# Patient Record
Sex: Male | Born: 1975 | Race: White | Hispanic: No | Smoking: Never smoker
Health system: Southern US, Community
[De-identification: ages and names within clinical notes are randomized; demographics above are authoritative.]

---

## 1997-11-17 ENCOUNTER — Emergency Department (HOSPITAL_COMMUNITY): Admission: EM | Admit: 1997-11-17 | Discharge: 1997-11-17 | Payer: Self-pay | Admitting: Emergency Medicine

## 1998-01-30 ENCOUNTER — Emergency Department (HOSPITAL_COMMUNITY): Admission: EM | Admit: 1998-01-30 | Discharge: 1998-01-30 | Payer: Self-pay

## 1998-05-02 ENCOUNTER — Encounter: Payer: Self-pay | Admitting: Emergency Medicine

## 1998-05-02 ENCOUNTER — Emergency Department (HOSPITAL_COMMUNITY): Admission: EM | Admit: 1998-05-02 | Discharge: 1998-05-02 | Payer: Self-pay | Admitting: Emergency Medicine

## 1998-09-03 ENCOUNTER — Emergency Department (HOSPITAL_COMMUNITY): Admission: EM | Admit: 1998-09-03 | Discharge: 1998-09-03 | Payer: Self-pay | Admitting: Emergency Medicine

## 1998-09-03 ENCOUNTER — Encounter: Payer: Self-pay | Admitting: Emergency Medicine

## 1999-01-11 ENCOUNTER — Encounter: Payer: Self-pay | Admitting: Emergency Medicine

## 1999-01-11 ENCOUNTER — Emergency Department (HOSPITAL_COMMUNITY): Admission: EM | Admit: 1999-01-11 | Discharge: 1999-01-11 | Payer: Self-pay | Admitting: Emergency Medicine

## 1999-03-14 ENCOUNTER — Encounter: Payer: Self-pay | Admitting: Emergency Medicine

## 1999-03-14 ENCOUNTER — Emergency Department (HOSPITAL_COMMUNITY): Admission: EM | Admit: 1999-03-14 | Discharge: 1999-03-14 | Payer: Self-pay | Admitting: Emergency Medicine

## 1999-05-29 ENCOUNTER — Encounter: Payer: Self-pay | Admitting: Emergency Medicine

## 1999-05-29 ENCOUNTER — Emergency Department (HOSPITAL_COMMUNITY): Admission: EM | Admit: 1999-05-29 | Discharge: 1999-05-29 | Payer: Self-pay | Admitting: Emergency Medicine

## 1999-08-18 ENCOUNTER — Emergency Department (HOSPITAL_COMMUNITY): Admission: EM | Admit: 1999-08-18 | Discharge: 1999-08-18 | Payer: Self-pay | Admitting: Emergency Medicine

## 1999-08-18 ENCOUNTER — Encounter: Payer: Self-pay | Admitting: Emergency Medicine

## 1999-12-22 ENCOUNTER — Emergency Department (HOSPITAL_COMMUNITY): Admission: EM | Admit: 1999-12-22 | Discharge: 1999-12-22 | Payer: Self-pay | Admitting: *Deleted

## 2000-05-01 ENCOUNTER — Encounter: Payer: Self-pay | Admitting: Occupational Medicine

## 2000-05-01 ENCOUNTER — Encounter: Admission: RE | Admit: 2000-05-01 | Discharge: 2000-05-01 | Payer: Self-pay | Admitting: Occupational Medicine

## 2000-06-20 ENCOUNTER — Emergency Department (HOSPITAL_COMMUNITY): Admission: EM | Admit: 2000-06-20 | Discharge: 2000-06-20 | Payer: Self-pay | Admitting: Emergency Medicine

## 2000-06-20 ENCOUNTER — Encounter: Payer: Self-pay | Admitting: Emergency Medicine

## 2000-11-09 ENCOUNTER — Emergency Department (HOSPITAL_COMMUNITY): Admission: EM | Admit: 2000-11-09 | Discharge: 2000-11-09 | Payer: Self-pay | Admitting: Emergency Medicine

## 2001-11-13 ENCOUNTER — Emergency Department (HOSPITAL_COMMUNITY): Admission: EM | Admit: 2001-11-13 | Discharge: 2001-11-13 | Payer: Self-pay | Admitting: Emergency Medicine

## 2001-11-13 ENCOUNTER — Encounter: Payer: Self-pay | Admitting: Emergency Medicine

## 2002-01-29 ENCOUNTER — Encounter: Payer: Self-pay | Admitting: Emergency Medicine

## 2002-01-29 ENCOUNTER — Emergency Department (HOSPITAL_COMMUNITY): Admission: EM | Admit: 2002-01-29 | Discharge: 2002-01-29 | Payer: Self-pay | Admitting: Emergency Medicine

## 2002-05-07 ENCOUNTER — Emergency Department (HOSPITAL_COMMUNITY): Admission: EM | Admit: 2002-05-07 | Discharge: 2002-05-07 | Payer: Self-pay | Admitting: Emergency Medicine

## 2002-05-26 ENCOUNTER — Encounter: Payer: Self-pay | Admitting: Emergency Medicine

## 2002-05-26 ENCOUNTER — Emergency Department (HOSPITAL_COMMUNITY): Admission: EM | Admit: 2002-05-26 | Discharge: 2002-05-26 | Payer: Self-pay | Admitting: Emergency Medicine

## 2002-06-27 ENCOUNTER — Encounter: Payer: Self-pay | Admitting: Family Medicine

## 2002-06-27 ENCOUNTER — Ambulatory Visit (HOSPITAL_COMMUNITY): Admission: RE | Admit: 2002-06-27 | Discharge: 2002-06-27 | Payer: Self-pay | Admitting: Family Medicine

## 2002-10-05 ENCOUNTER — Emergency Department (HOSPITAL_COMMUNITY): Admission: EM | Admit: 2002-10-05 | Discharge: 2002-10-05 | Payer: Self-pay | Admitting: Emergency Medicine

## 2003-01-17 ENCOUNTER — Encounter: Payer: Self-pay | Admitting: Emergency Medicine

## 2003-01-17 ENCOUNTER — Emergency Department (HOSPITAL_COMMUNITY): Admission: EM | Admit: 2003-01-17 | Discharge: 2003-01-17 | Payer: Self-pay | Admitting: Emergency Medicine

## 2003-03-26 ENCOUNTER — Encounter: Payer: Self-pay | Admitting: Family Medicine

## 2003-03-26 ENCOUNTER — Emergency Department (HOSPITAL_COMMUNITY): Admission: AD | Admit: 2003-03-26 | Discharge: 2003-03-26 | Payer: Self-pay | Admitting: Family Medicine

## 2003-05-26 ENCOUNTER — Emergency Department (HOSPITAL_COMMUNITY): Admission: EM | Admit: 2003-05-26 | Discharge: 2003-05-26 | Payer: Self-pay | Admitting: Family Medicine

## 2003-06-11 ENCOUNTER — Emergency Department (HOSPITAL_COMMUNITY): Admission: EM | Admit: 2003-06-11 | Discharge: 2003-06-11 | Payer: Self-pay | Admitting: Emergency Medicine

## 2003-06-13 ENCOUNTER — Emergency Department (HOSPITAL_COMMUNITY): Admission: EM | Admit: 2003-06-13 | Discharge: 2003-06-14 | Payer: Self-pay | Admitting: Emergency Medicine

## 2003-06-16 ENCOUNTER — Emergency Department (HOSPITAL_COMMUNITY): Admission: EM | Admit: 2003-06-16 | Discharge: 2003-06-16 | Payer: Self-pay | Admitting: *Deleted

## 2003-08-22 ENCOUNTER — Emergency Department (HOSPITAL_COMMUNITY): Admission: EM | Admit: 2003-08-22 | Discharge: 2003-08-22 | Payer: Self-pay | Admitting: Family Medicine

## 2003-08-28 ENCOUNTER — Emergency Department (HOSPITAL_COMMUNITY): Admission: EM | Admit: 2003-08-28 | Discharge: 2003-08-28 | Payer: Self-pay | Admitting: Emergency Medicine

## 2003-09-29 ENCOUNTER — Emergency Department (HOSPITAL_COMMUNITY): Admission: AC | Admit: 2003-09-29 | Discharge: 2003-09-29 | Payer: Self-pay

## 2003-10-01 ENCOUNTER — Emergency Department (HOSPITAL_COMMUNITY): Admission: EM | Admit: 2003-10-01 | Discharge: 2003-10-01 | Payer: Self-pay | Admitting: Emergency Medicine

## 2003-10-10 ENCOUNTER — Emergency Department (HOSPITAL_COMMUNITY): Admission: EM | Admit: 2003-10-10 | Discharge: 2003-10-10 | Payer: Self-pay | Admitting: Family Medicine

## 2003-10-12 ENCOUNTER — Emergency Department (HOSPITAL_COMMUNITY): Admission: EM | Admit: 2003-10-12 | Discharge: 2003-10-12 | Payer: Self-pay | Admitting: Emergency Medicine

## 2003-10-17 ENCOUNTER — Emergency Department (HOSPITAL_COMMUNITY): Admission: EM | Admit: 2003-10-17 | Discharge: 2003-10-17 | Payer: Self-pay | Admitting: *Deleted

## 2003-10-17 ENCOUNTER — Emergency Department (HOSPITAL_COMMUNITY): Admission: EM | Admit: 2003-10-17 | Discharge: 2003-10-17 | Payer: Self-pay | Admitting: Emergency Medicine

## 2003-11-21 ENCOUNTER — Inpatient Hospital Stay (HOSPITAL_COMMUNITY): Admission: EM | Admit: 2003-11-21 | Discharge: 2003-11-22 | Payer: Self-pay | Admitting: Emergency Medicine

## 2003-11-22 ENCOUNTER — Inpatient Hospital Stay (HOSPITAL_COMMUNITY): Admission: AD | Admit: 2003-11-22 | Discharge: 2003-11-25 | Payer: Self-pay | Admitting: Psychiatry

## 2003-11-26 ENCOUNTER — Emergency Department (HOSPITAL_COMMUNITY): Admission: EM | Admit: 2003-11-26 | Discharge: 2003-11-27 | Payer: Self-pay | Admitting: Emergency Medicine

## 2003-12-03 ENCOUNTER — Inpatient Hospital Stay (HOSPITAL_COMMUNITY): Admission: EM | Admit: 2003-12-03 | Discharge: 2003-12-04 | Payer: Self-pay | Admitting: Emergency Medicine

## 2003-12-04 ENCOUNTER — Inpatient Hospital Stay (HOSPITAL_COMMUNITY): Admission: RE | Admit: 2003-12-04 | Discharge: 2003-12-05 | Payer: Self-pay | Admitting: Psychiatry

## 2003-12-16 ENCOUNTER — Emergency Department (HOSPITAL_COMMUNITY): Admission: EM | Admit: 2003-12-16 | Discharge: 2003-12-16 | Payer: Self-pay | Admitting: Emergency Medicine

## 2004-04-05 ENCOUNTER — Emergency Department (HOSPITAL_COMMUNITY): Admission: EM | Admit: 2004-04-05 | Discharge: 2004-04-05 | Payer: Self-pay | Admitting: Emergency Medicine

## 2004-05-09 ENCOUNTER — Emergency Department (HOSPITAL_COMMUNITY): Admission: EM | Admit: 2004-05-09 | Discharge: 2004-05-10 | Payer: Self-pay | Admitting: Emergency Medicine

## 2004-05-14 ENCOUNTER — Emergency Department (HOSPITAL_COMMUNITY): Admission: EM | Admit: 2004-05-14 | Discharge: 2004-05-14 | Payer: Self-pay | Admitting: Emergency Medicine

## 2004-05-18 ENCOUNTER — Ambulatory Visit (HOSPITAL_COMMUNITY): Admission: RE | Admit: 2004-05-18 | Discharge: 2004-05-18 | Payer: Self-pay | Admitting: Family Medicine

## 2004-05-18 ENCOUNTER — Emergency Department (HOSPITAL_COMMUNITY): Admission: EM | Admit: 2004-05-18 | Discharge: 2004-05-18 | Payer: Self-pay | Admitting: Family Medicine

## 2005-12-24 ENCOUNTER — Emergency Department (HOSPITAL_COMMUNITY): Admission: EM | Admit: 2005-12-24 | Discharge: 2005-12-25 | Payer: Self-pay | Admitting: Emergency Medicine

## 2005-12-28 ENCOUNTER — Ambulatory Visit (HOSPITAL_COMMUNITY): Admission: RE | Admit: 2005-12-28 | Discharge: 2005-12-28 | Payer: Self-pay | Admitting: Orthopedic Surgery

## 2006-01-04 ENCOUNTER — Encounter: Admission: RE | Admit: 2006-01-04 | Discharge: 2006-04-04 | Payer: Self-pay | Admitting: Orthopedic Surgery

## 2006-01-26 ENCOUNTER — Emergency Department (HOSPITAL_COMMUNITY): Admission: EM | Admit: 2006-01-26 | Discharge: 2006-01-26 | Payer: Self-pay | Admitting: Emergency Medicine

## 2006-02-11 ENCOUNTER — Emergency Department (HOSPITAL_COMMUNITY): Admission: EM | Admit: 2006-02-11 | Discharge: 2006-02-11 | Payer: Self-pay | Admitting: Family Medicine

## 2006-05-02 ENCOUNTER — Emergency Department (HOSPITAL_COMMUNITY): Admission: EM | Admit: 2006-05-02 | Discharge: 2006-05-02 | Payer: Self-pay | Admitting: Emergency Medicine

## 2006-06-05 ENCOUNTER — Emergency Department (HOSPITAL_COMMUNITY): Admission: EM | Admit: 2006-06-05 | Discharge: 2006-06-05 | Payer: Self-pay | Admitting: Family Medicine

## 2006-06-10 ENCOUNTER — Emergency Department: Payer: Self-pay | Admitting: Emergency Medicine

## 2006-06-17 ENCOUNTER — Emergency Department (HOSPITAL_COMMUNITY): Admission: EM | Admit: 2006-06-17 | Discharge: 2006-06-17 | Payer: Self-pay | Admitting: Emergency Medicine

## 2006-08-07 ENCOUNTER — Emergency Department (HOSPITAL_COMMUNITY): Admission: EM | Admit: 2006-08-07 | Discharge: 2006-08-07 | Payer: Self-pay | Admitting: Emergency Medicine

## 2006-08-26 ENCOUNTER — Emergency Department (HOSPITAL_COMMUNITY): Admission: EM | Admit: 2006-08-26 | Discharge: 2006-08-26 | Payer: Self-pay | Admitting: Emergency Medicine

## 2006-09-05 ENCOUNTER — Emergency Department (HOSPITAL_COMMUNITY): Admission: EM | Admit: 2006-09-05 | Discharge: 2006-09-05 | Payer: Self-pay | Admitting: Family Medicine

## 2007-02-20 ENCOUNTER — Emergency Department (HOSPITAL_COMMUNITY): Admission: EM | Admit: 2007-02-20 | Discharge: 2007-02-21 | Payer: Self-pay | Admitting: Emergency Medicine

## 2007-03-31 ENCOUNTER — Emergency Department (HOSPITAL_COMMUNITY): Admission: EM | Admit: 2007-03-31 | Discharge: 2007-04-01 | Payer: Self-pay | Admitting: Emergency Medicine

## 2007-04-02 ENCOUNTER — Emergency Department (HOSPITAL_COMMUNITY): Admission: EM | Admit: 2007-04-02 | Discharge: 2007-04-02 | Payer: Self-pay | Admitting: Emergency Medicine

## 2007-04-13 ENCOUNTER — Ambulatory Visit (HOSPITAL_COMMUNITY): Admission: RE | Admit: 2007-04-13 | Discharge: 2007-04-13 | Payer: Self-pay | Admitting: Specialist

## 2007-05-20 ENCOUNTER — Emergency Department (HOSPITAL_COMMUNITY): Admission: EM | Admit: 2007-05-20 | Discharge: 2007-05-20 | Payer: Self-pay | Admitting: Emergency Medicine

## 2007-05-31 ENCOUNTER — Emergency Department (HOSPITAL_COMMUNITY): Admission: EM | Admit: 2007-05-31 | Discharge: 2007-05-31 | Payer: Self-pay | Admitting: Emergency Medicine

## 2007-06-13 ENCOUNTER — Inpatient Hospital Stay (HOSPITAL_COMMUNITY): Admission: AC | Admit: 2007-06-13 | Discharge: 2007-06-18 | Payer: Self-pay

## 2007-06-23 ENCOUNTER — Emergency Department (HOSPITAL_COMMUNITY): Admission: EM | Admit: 2007-06-23 | Discharge: 2007-06-23 | Payer: Self-pay | Admitting: Emergency Medicine

## 2007-07-01 ENCOUNTER — Emergency Department (HOSPITAL_COMMUNITY): Admission: EM | Admit: 2007-07-01 | Discharge: 2007-07-02 | Payer: Self-pay | Admitting: Emergency Medicine

## 2007-07-08 ENCOUNTER — Emergency Department (HOSPITAL_COMMUNITY): Admission: EM | Admit: 2007-07-08 | Discharge: 2007-07-08 | Payer: Self-pay | Admitting: Emergency Medicine

## 2007-07-13 ENCOUNTER — Ambulatory Visit (HOSPITAL_COMMUNITY): Admission: RE | Admit: 2007-07-13 | Discharge: 2007-07-13 | Payer: Self-pay | Admitting: General Surgery

## 2007-07-28 ENCOUNTER — Emergency Department (HOSPITAL_COMMUNITY): Admission: EM | Admit: 2007-07-28 | Discharge: 2007-07-28 | Payer: Self-pay | Admitting: Emergency Medicine

## 2007-07-30 ENCOUNTER — Emergency Department (HOSPITAL_COMMUNITY): Admission: EM | Admit: 2007-07-30 | Discharge: 2007-07-30 | Payer: Self-pay | Admitting: Emergency Medicine

## 2007-10-20 ENCOUNTER — Emergency Department (HOSPITAL_COMMUNITY): Admission: EM | Admit: 2007-10-20 | Discharge: 2007-10-21 | Payer: Self-pay | Admitting: Emergency Medicine

## 2007-10-21 ENCOUNTER — Emergency Department (HOSPITAL_COMMUNITY): Admission: EM | Admit: 2007-10-21 | Discharge: 2007-10-21 | Payer: Self-pay | Admitting: Emergency Medicine

## 2007-12-18 ENCOUNTER — Emergency Department (HOSPITAL_COMMUNITY): Admission: EM | Admit: 2007-12-18 | Discharge: 2007-12-18 | Payer: Self-pay | Admitting: Emergency Medicine

## 2007-12-29 ENCOUNTER — Emergency Department (HOSPITAL_COMMUNITY): Admission: EM | Admit: 2007-12-29 | Discharge: 2007-12-29 | Payer: Self-pay | Admitting: Emergency Medicine

## 2008-02-23 ENCOUNTER — Emergency Department (HOSPITAL_COMMUNITY): Admission: EM | Admit: 2008-02-23 | Discharge: 2008-02-23 | Payer: Self-pay | Admitting: Emergency Medicine

## 2008-02-24 ENCOUNTER — Emergency Department (HOSPITAL_COMMUNITY): Admission: EM | Admit: 2008-02-24 | Discharge: 2008-02-24 | Payer: Self-pay | Admitting: Family Medicine

## 2008-02-26 IMAGING — CR DG PELVIS 1-2V
1 series · 1 of 1 positions shown · non-contrast
Comparison: none

CLINICAL DATA: Fall

PELVIS - 1  VIEW:

[t pelvis a.p.]
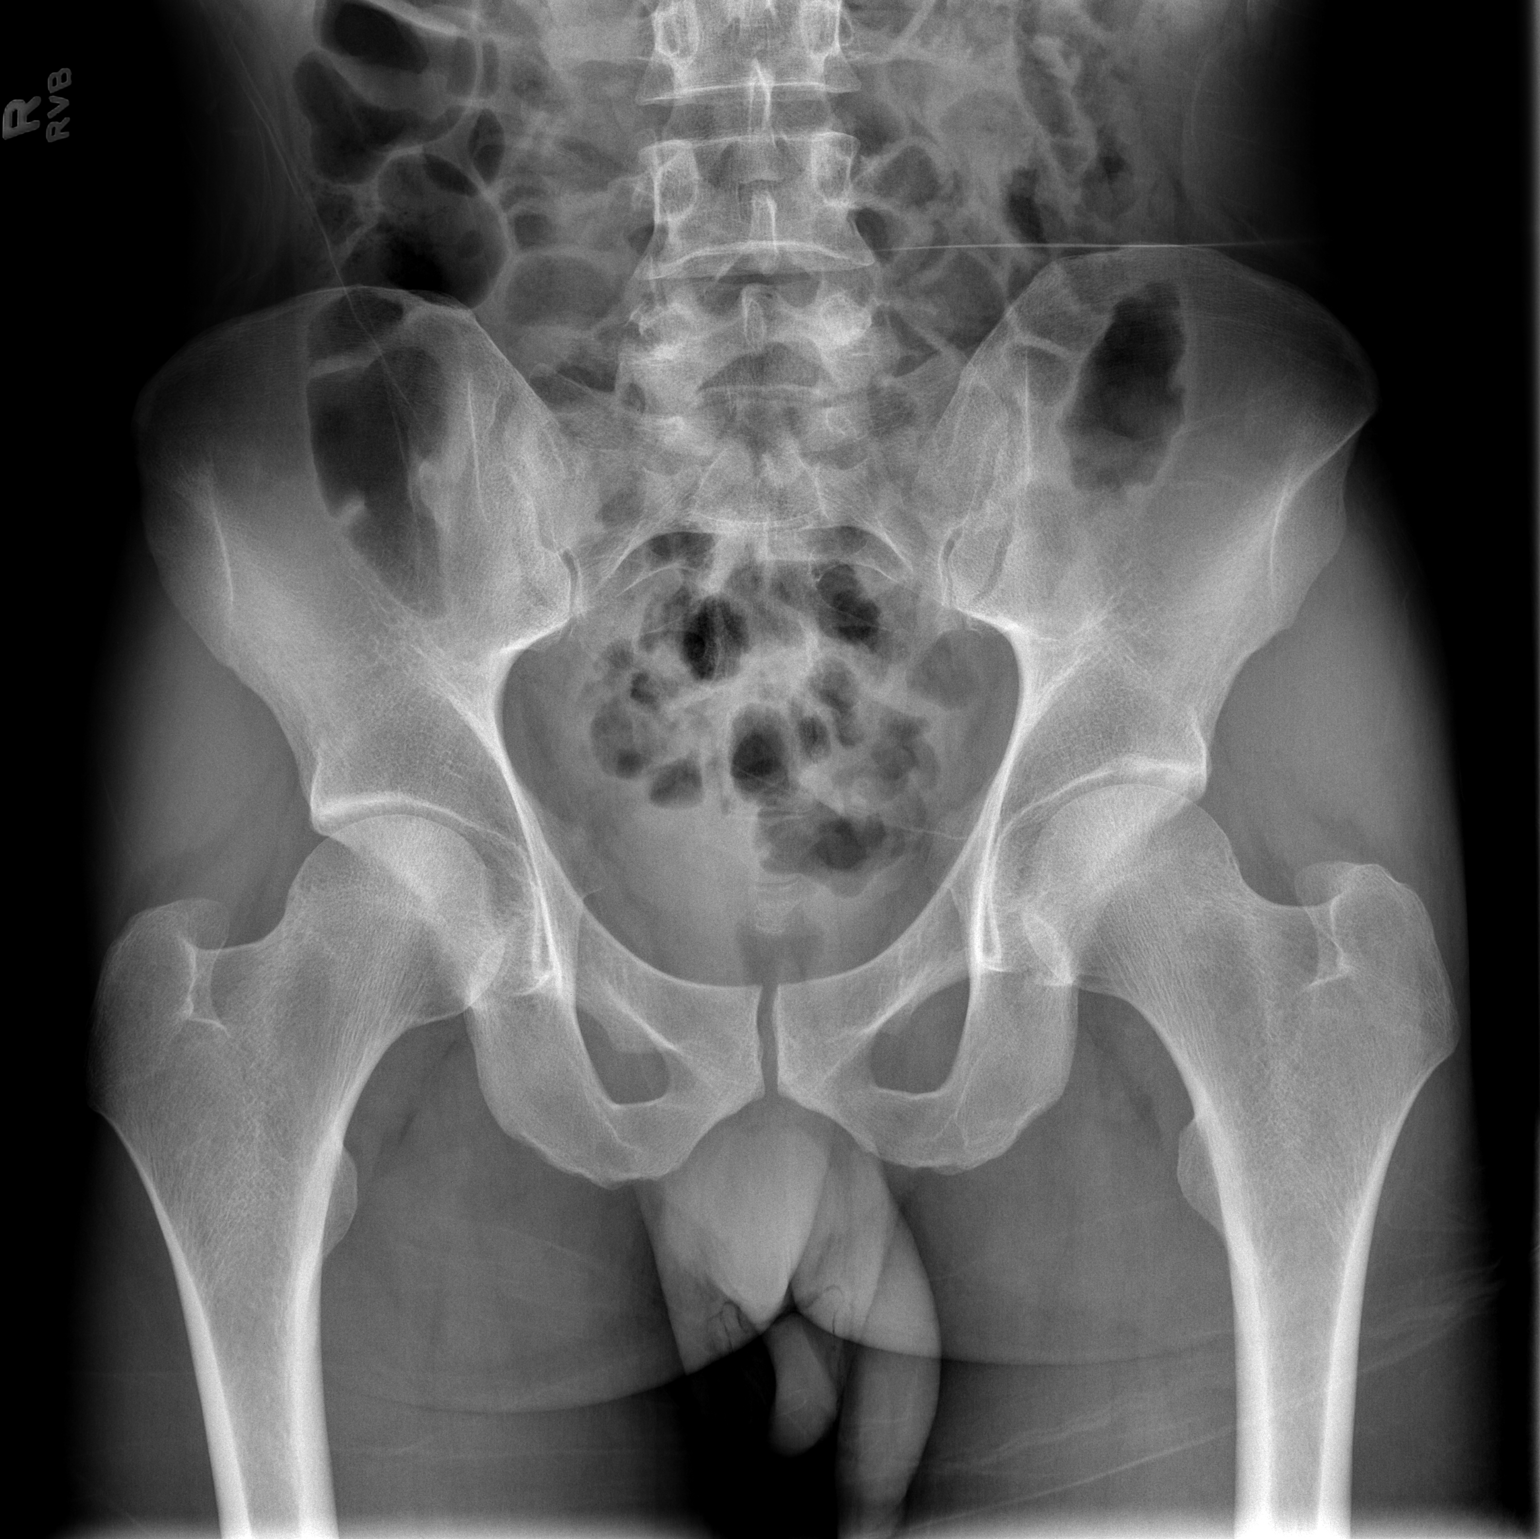

[1 of 1 positions shown; findings below may reference images not displayed]

FINDINGS: There is no evidence of pelvic fracture or diastasis.  No other
pelvic bone lesions are seen.
IMPRESSION: Negative.

## 2008-05-02 ENCOUNTER — Emergency Department (HOSPITAL_COMMUNITY): Admission: EM | Admit: 2008-05-02 | Discharge: 2008-05-03 | Payer: Self-pay | Admitting: Emergency Medicine

## 2008-05-12 ENCOUNTER — Emergency Department (HOSPITAL_COMMUNITY): Admission: EM | Admit: 2008-05-12 | Discharge: 2008-05-12 | Payer: Self-pay | Admitting: Emergency Medicine

## 2008-06-14 ENCOUNTER — Emergency Department (HOSPITAL_COMMUNITY): Admission: EM | Admit: 2008-06-14 | Discharge: 2008-06-14 | Payer: Self-pay | Admitting: Emergency Medicine

## 2008-07-02 ENCOUNTER — Emergency Department (HOSPITAL_COMMUNITY): Admission: EM | Admit: 2008-07-02 | Discharge: 2008-07-02 | Payer: Self-pay | Admitting: Emergency Medicine

## 2008-07-17 ENCOUNTER — Emergency Department (HOSPITAL_COMMUNITY): Admission: EM | Admit: 2008-07-17 | Discharge: 2008-07-17 | Payer: Self-pay | Admitting: Family Medicine

## 2008-09-28 ENCOUNTER — Emergency Department (HOSPITAL_COMMUNITY): Admission: EM | Admit: 2008-09-28 | Discharge: 2008-09-28 | Payer: Self-pay | Admitting: Emergency Medicine

## 2008-11-04 ENCOUNTER — Emergency Department (HOSPITAL_COMMUNITY): Admission: EM | Admit: 2008-11-04 | Discharge: 2008-11-04 | Payer: Self-pay | Admitting: Emergency Medicine

## 2008-11-22 ENCOUNTER — Emergency Department (HOSPITAL_COMMUNITY): Admission: EM | Admit: 2008-11-22 | Discharge: 2008-11-22 | Payer: Self-pay | Admitting: Emergency Medicine

## 2008-11-26 ENCOUNTER — Emergency Department (HOSPITAL_COMMUNITY): Admission: EM | Admit: 2008-11-26 | Discharge: 2008-11-26 | Payer: Self-pay | Admitting: Family Medicine

## 2008-11-29 ENCOUNTER — Emergency Department (HOSPITAL_COMMUNITY): Admission: EM | Admit: 2008-11-29 | Discharge: 2008-11-29 | Payer: Self-pay | Admitting: Emergency Medicine

## 2009-01-13 ENCOUNTER — Emergency Department (HOSPITAL_COMMUNITY): Admission: EM | Admit: 2009-01-13 | Discharge: 2009-01-13 | Payer: Self-pay | Admitting: Emergency Medicine

## 2009-01-13 IMAGING — CR DG CHEST 2V
2 series · 2 of 2 positions shown · non-contrast
Comparison: 05/12/2008

CLINICAL DATA: Chest pain and cough

CHEST - 2 VIEW

[w chest pa]
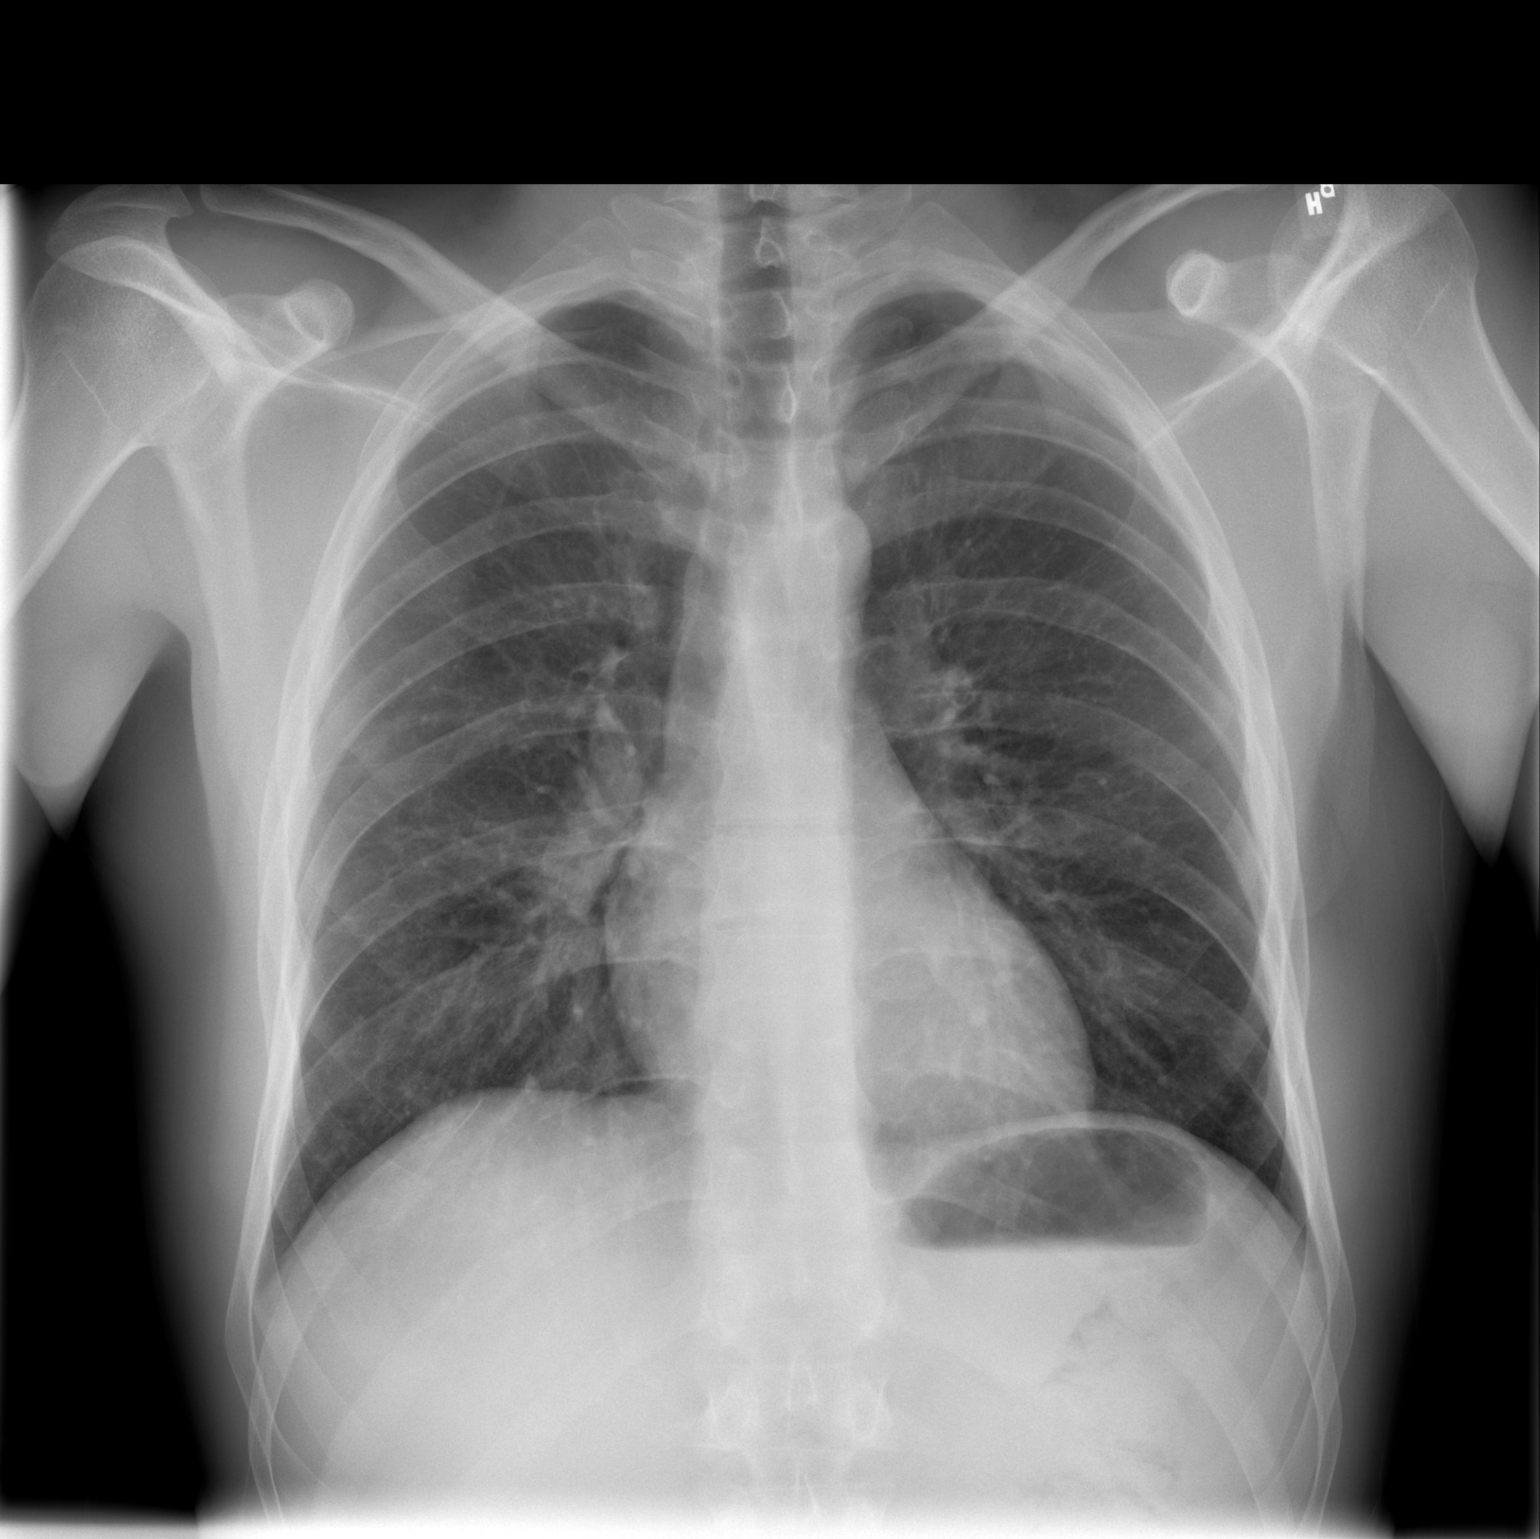

[w chest lat]
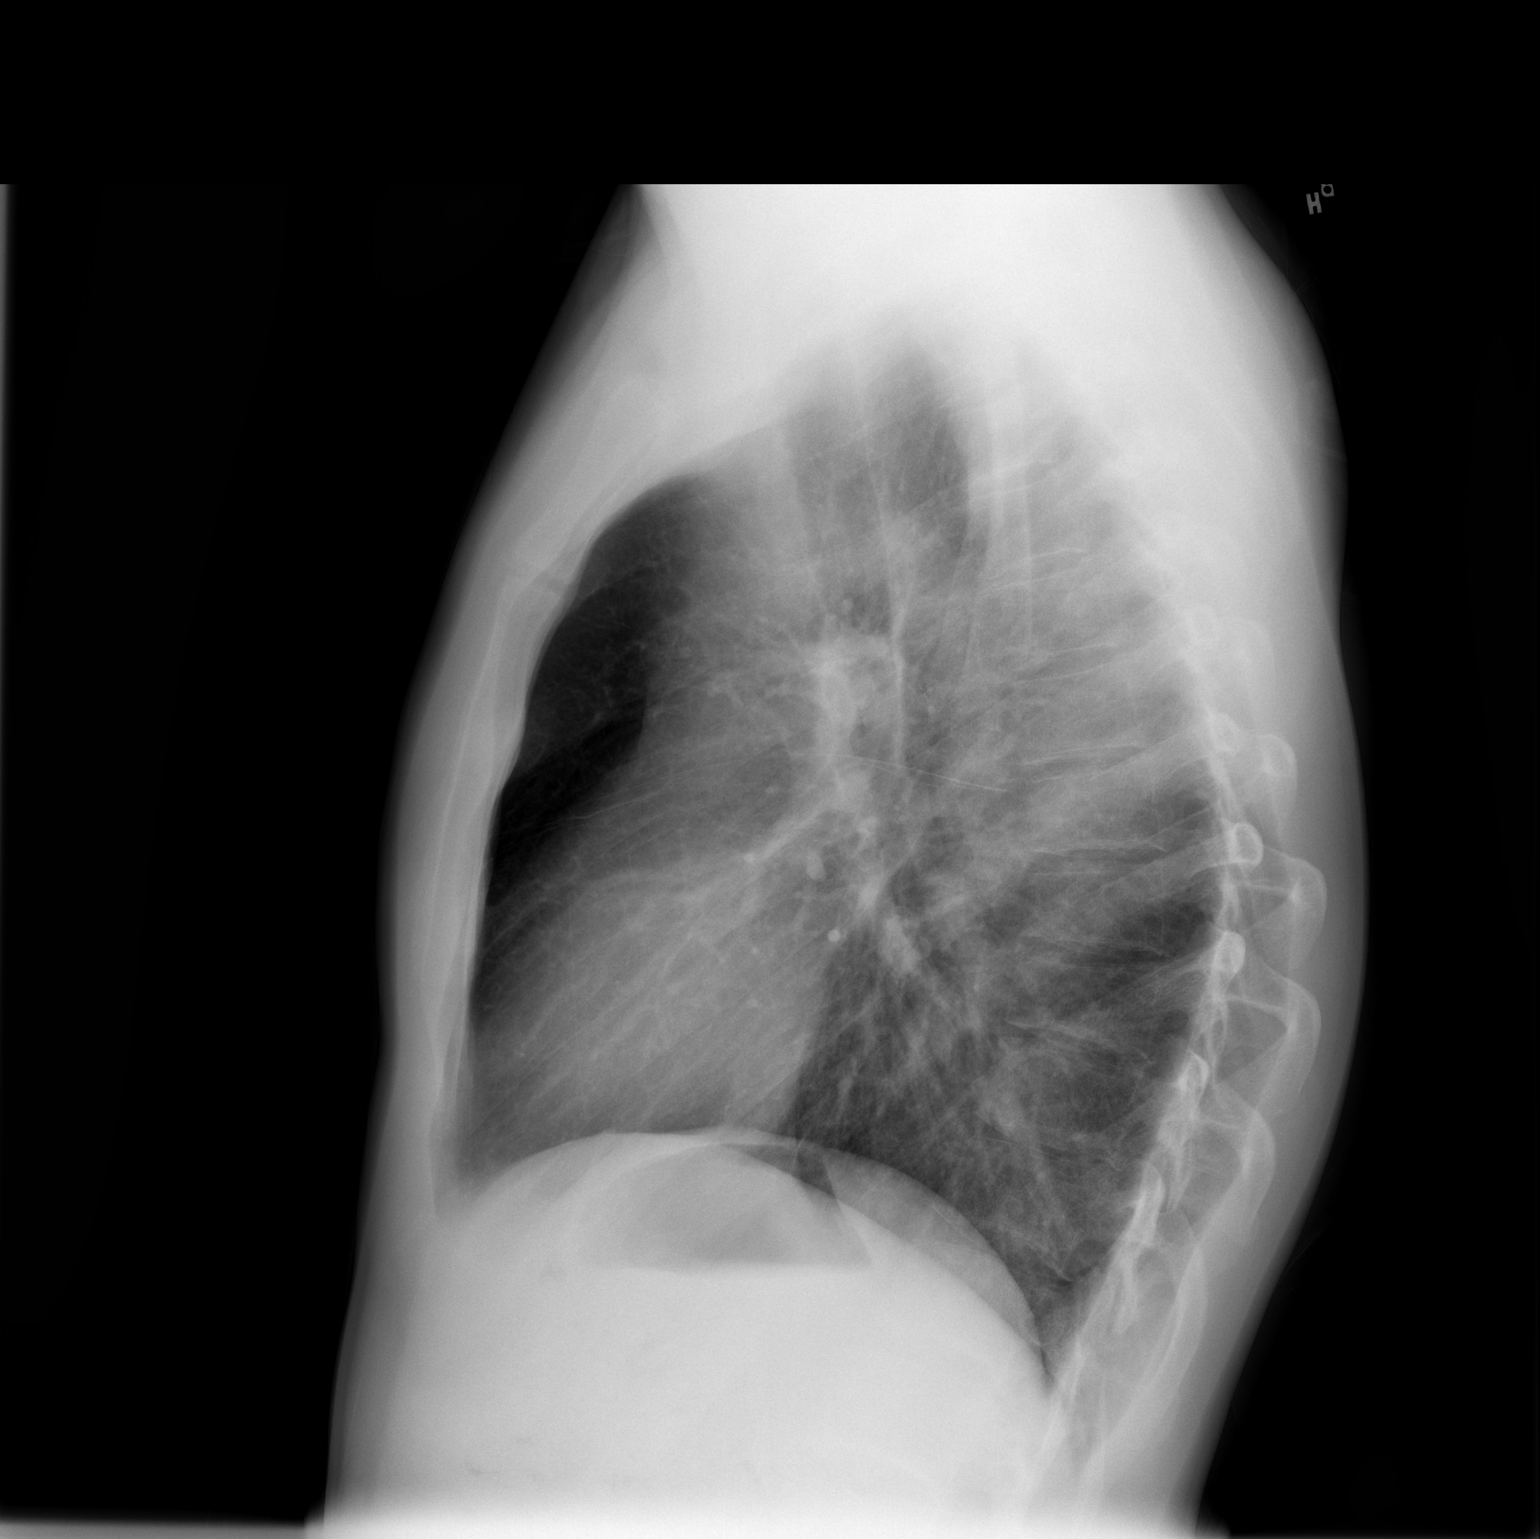

[2 of 2 positions shown; findings below may reference images not displayed]

FINDINGS: Heart size is normal.

No pleural effusion or pulmonary interstitial edema.

No airspace densities are identified.
IMPRESSION: 1.  No acute findings.

## 2009-11-14 ENCOUNTER — Emergency Department (HOSPITAL_COMMUNITY): Admission: EM | Admit: 2009-11-14 | Discharge: 2009-11-14 | Payer: Self-pay | Admitting: Emergency Medicine

## 2009-12-03 ENCOUNTER — Emergency Department (HOSPITAL_COMMUNITY): Admission: EM | Admit: 2009-12-03 | Discharge: 2009-12-03 | Payer: Self-pay | Admitting: Family Medicine

## 2009-12-21 ENCOUNTER — Emergency Department (HOSPITAL_COMMUNITY): Admission: EM | Admit: 2009-12-21 | Discharge: 2009-12-21 | Payer: Self-pay | Admitting: Emergency Medicine

## 2010-02-17 ENCOUNTER — Ambulatory Visit (HOSPITAL_COMMUNITY): Admission: RE | Admit: 2010-02-17 | Discharge: 2010-02-17 | Payer: Self-pay | Admitting: Otolaryngology

## 2010-03-29 ENCOUNTER — Emergency Department (HOSPITAL_COMMUNITY): Admission: EM | Admit: 2010-03-29 | Discharge: 2010-03-29 | Payer: Self-pay | Admitting: Emergency Medicine

## 2010-05-16 ENCOUNTER — Emergency Department (HOSPITAL_COMMUNITY)
Admission: EM | Admit: 2010-05-16 | Discharge: 2010-05-16 | Payer: Self-pay | Source: Home / Self Care | Admitting: Emergency Medicine

## 2010-06-27 ENCOUNTER — Encounter: Payer: Self-pay | Admitting: General Surgery

## 2010-08-17 LAB — URINALYSIS, ROUTINE W REFLEX MICROSCOPIC
Nitrite: NEGATIVE
Specific Gravity, Urine: 1.021 (ref 1.005–1.030)
Urobilinogen, UA: 0.2 mg/dL (ref 0.0–1.0)
pH: 5.5 (ref 5.0–8.0)

## 2010-08-17 LAB — COMPREHENSIVE METABOLIC PANEL
ALT: 18 U/L (ref 0–53)
AST: 36 U/L (ref 0–37)
Albumin: 4.8 g/dL (ref 3.5–5.2)
Chloride: 99 mEq/L (ref 96–112)
Creatinine, Ser: 1.38 mg/dL (ref 0.4–1.5)
GFR calc Af Amer: >60 mL/min (ref >60)
Potassium: 4 mEq/L (ref 3.5–5.1)
Sodium: 137 mEq/L (ref 135–145)
Total Bilirubin: 1 mg/dL (ref 0.3–1.2)

## 2010-08-17 LAB — CBC
Platelets: 244 10*3/uL (ref 150–400)
RBC: 5.2 MIL/uL (ref 4.22–5.81)
WBC: 8.8 10*3/uL (ref 4.0–10.5)

## 2010-08-17 LAB — DIFFERENTIAL
Basophils Absolute: 0 10*3/uL (ref 0.0–0.1)
Eosinophils Relative: 2 % (ref 0–5)
Lymphocytes Relative: 28 % (ref 12–46)
Monocytes Absolute: 0.7 10*3/uL (ref 0.1–1.0)

## 2010-08-19 LAB — CBC
HCT: 43.5 % (ref 39.0–52.0)
Hemoglobin: 15 g/dL (ref 13.0–17.0)
MCH: 31.2 pg (ref 26.0–34.0)
MCHC: 34.5 g/dL (ref 30.0–36.0)

## 2010-08-19 LAB — SURGICAL PCR SCREEN: Staphylococcus aureus: NEGATIVE

## 2010-08-30 ENCOUNTER — Emergency Department (HOSPITAL_COMMUNITY)
Admission: EM | Admit: 2010-08-30 | Discharge: 2010-08-30 | Disposition: A | Discharge status: Home / Self Care | Payer: Medicaid Other | Attending: Emergency Medicine | Admitting: Emergency Medicine

## 2010-08-30 DIAGNOSIS — IMO0002 Reserved for concepts with insufficient information to code with codable children: Secondary | ICD-10-CM | POA: Insufficient documentation

## 2010-08-30 DIAGNOSIS — M79609 Pain in unspecified limb: Secondary | ICD-10-CM | POA: Insufficient documentation

## 2010-08-30 DIAGNOSIS — M7989 Other specified soft tissue disorders: Secondary | ICD-10-CM | POA: Insufficient documentation

## 2010-08-30 LAB — DIFFERENTIAL
Eosinophils Absolute: 0.1 10*3/uL (ref 0.0–0.7)
Eosinophils Relative: 1 % (ref 0–5)
Lymphs Abs: 1.6 10*3/uL (ref 0.7–4.0)
Monocytes Absolute: 0.9 10*3/uL (ref 0.1–1.0)
Monocytes Relative: 7 % (ref 3–12)

## 2010-08-30 LAB — POCT I-STAT, CHEM 8
BUN: 8 mg/dL (ref 6–23)
Calcium, Ion: 1.1 mmol/L — ABNORMAL LOW (ref 1.12–1.32)
Chloride: 101 mEq/L (ref 96–112)
Glucose, Bld: 148 mg/dL — ABNORMAL HIGH (ref 70–99)

## 2010-08-30 LAB — CBC
MCH: 31.4 pg (ref 26.0–34.0)
MCV: 89.3 fL (ref 78.0–100.0)
Platelets: 236 10*3/uL (ref 150–400)
RDW: 12.5 % (ref 11.5–15.5)

## 2010-09-12 LAB — URINALYSIS, ROUTINE W REFLEX MICROSCOPIC
Glucose, UA: NEGATIVE mg/dL
Hgb urine dipstick: NEGATIVE
Protein, ur: NEGATIVE mg/dL
Specific Gravity, Urine: >1.046 — ABNORMAL HIGH (ref 1.005–1.030)
pH: 6.5 (ref 5.0–8.0)

## 2010-09-12 LAB — DIFFERENTIAL
Eosinophils Absolute: 0.4 10*3/uL (ref 0.0–0.7)
Lymphocytes Relative: 14 % (ref 12–46)
Lymphs Abs: 1.2 10*3/uL (ref 0.7–4.0)
Monocytes Relative: 5 % (ref 3–12)
Neutrophils Relative %: 76 % (ref 43–77)

## 2010-09-12 LAB — COMPREHENSIVE METABOLIC PANEL
ALT: 37 U/L (ref 0–53)
AST: 67 U/L — ABNORMAL HIGH (ref 0–37)
Calcium: 9.4 mg/dL (ref 8.4–10.5)
Creatinine, Ser: 1.04 mg/dL (ref 0.4–1.5)
GFR calc Af Amer: >60 mL/min (ref >60)
Glucose, Bld: 108 mg/dL — ABNORMAL HIGH (ref 70–99)
Sodium: 136 mEq/L (ref 135–145)
Total Protein: 7.5 g/dL (ref 6.0–8.3)

## 2010-09-12 LAB — RAPID URINE DRUG SCREEN, HOSP PERFORMED
Amphetamines: NOT DETECTED
Barbiturates: NOT DETECTED
Benzodiazepines: POSITIVE — AB
Cocaine: NOT DETECTED
Opiates: POSITIVE — AB

## 2010-09-12 LAB — CBC
MCHC: 33.9 g/dL (ref 30.0–36.0)
RDW: 15 % (ref 11.5–15.5)

## 2010-09-20 LAB — URINALYSIS, ROUTINE W REFLEX MICROSCOPIC
Bilirubin Urine: NEGATIVE
Glucose, UA: NEGATIVE mg/dL
Hgb urine dipstick: NEGATIVE
Ketones, ur: NEGATIVE mg/dL
Protein, ur: NEGATIVE mg/dL
Urobilinogen, UA: 0.2 mg/dL (ref 0.0–1.0)
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
## 2010-10-22 NOTE — Discharge Summary (Signed)
NAME:  Patrick Herman, Patrick Herman                       ACCOUNT NO.:  [id number]   MEDICAL RECORD NO.:  [id number]                   PATIENT TYPE:  IPS   LOCATION:  0304                                 FACILITY:  [facility]   PHYSICIAN:  Geoffery Lyons, M.D.                   DATE OF BIRTH:  1976-03-29   DATE OF ADMISSION:  12/04/2003  DATE OF DISCHARGE:  12/05/2003                                 DISCHARGE SUMMARY   CHIEF COMPLAINT AND PRESENTING ILLNESS:  This was the second admission to  [facility]  for this 35 year old single white male,  voluntarily admitted, history of depression.  He tried to work things out  with his wife.  She left.  The ex-wife is pregnant with his child.  He felt  that he needed group therapy to talk things out.  He was not suicidal, no  history of overdose.  Not sure how to handle his life, feeling pretty  overwhelmed.   PAST PSYCHIATRIC HISTORY:  In methadone clinic, Dr. Betti Cruz sees him.   ALCOHOL AND DRUG HISTORY:  Prior history of opiate dependence, on  maintenance treatment with methadone.   PAST MEDICAL HISTORY:  Inguinal hernia.   MEDICATIONS:  Seroquel 25 every 6 hours as needed, Protonix 40 mg per day,  Ambien 10 at bedtime for sleep, trazodone 100 h.s. p.r.n., methadone 100 mg  through the methadone clinic, Klonopin 1 mg every 6 hours as needed.   MENTAL STATUS EXAM:  Reveals an alert man, cooperative, good eye contact.  Speech clear, normal rate, tempo and production.  Mood depressed, affect  broad, thought processes logical, coherent and relevant to his situation,  wanting to do better.  Cognition well preserved.   ADMISSION DIAGNOSES:   AXIS I:  1. Depressive disorder not otherwise specified.  2. Opiate dependence.   AXIS II:  No diagnosis.   AXIS III:  Inguinal hernia.   AXIS IV:  Moderate.   AXIS V:  Global assessment of function upon admission 40-45, highest global  assessment of function in past year 65.   COURSE IN HOSPITAL:   He was admitted.  He was not able to contract for  safety.  He was transferred to [facility] for further evaluation  and treatment.   DISCHARGE DIAGNOSES:   AXIS I:  1. Depressive disorder not otherwise specified.  2. Opiate dependence.   AXIS II:  No diagnosis.   AXIS III:  Inguinal hernia.   AXIS IV:  Moderate.   AXIS V:  Global assessment of function upon discharge 45.   DISCHARGE MEDICATIONS:  Discharged on the same medications, to be  reassessed, treated at [facility]  Geoffery Lyons, M.D.    IL/MEDQ  D:  12/30/2003  T:  12/31/2003  Job:  [id number]
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
## 2010-10-22 NOTE — H&P (Signed)
NAME:  Patrick Herman, Patrick Herman                       ACCOUNT NO.:  [id number]   MEDICAL RECORD NO.:  [id number]                   PATIENT TYPE:  EMS   LOCATION:  ED                                   FACILITY:  [facility]   PHYSICIAN:  Deirdre Peer. Polite, M.D.              DATE OF BIRTH:  1975/07/10   DATE OF ADMISSION:  12/02/2003  DATE OF DISCHARGE:                                HISTORY & PHYSICAL   CHIEF COMPLAINT:  None as the patient is somnolent.   HISTORY OF PRESENT ILLNESS:  The history of present illness is essentially  from the ED chart as the patient is very somnolent.   Patrick Herman is a 35 year old male with known history of depression with  recent suicide attempt in June 2005 who presented to the ED very somnolent  and desiring to go back to [employer].  Of note, the patient was  recently admitted to [facility] for three days.  In the ED the  patient was evaluated and found to be very somnolent.  He responded somewhat  when given Narcan.  The patient had a urine drug screen, which was positive  for benzos, otherwise negative.  No acetaminophen or Tylenol level was  ordered.  The patient had a BMET, which was within normal limits.  ______  Team was called to evaluate the patient due to the patient's excessive  somnolence.  It was felt that medical clearance was needed; therefore [facility] was called.   At the time of my arrival the patient was still very somnolent and unable to  give the history that was felt to be reliable.  The patient denies any pain,  denies trying to kill himself, otherwise I am not able to get any  significant information.   PAST MEDICAL HISTORY:  The past medical history from the old chart shows:  1. History of depression.  2. History of suicide attempt.   MEDICATIONS:  Medications from the old chart shows the patient is taking:  1. Klonopin 1 mg t.i.d.  2. Methadone 100 mg daily.  3. Questionable Celexa.  4. Questionable  Soma,   SOCIAL HISTORY:  The patient states that he smokes about a pack per day.  Denies any alcohol.  Denies any drugs.   PAST SURGICAL HISTORY:  Unobtainable.   ALLERGIES:  Unobtainable.   FAMILY HISTORY:  Noncontributory.   REVIEW OF SYSTEMS:  Unobtainable.   PHYSICAL EXAMINATION:  GENERAL APPEARANCE:  In general the patient is very  somnolent aroused; only answers ith one ot two words.  VITAL SIGNS:  Temp 97.5, BP 116/68, pulse 64 and respiratory rate of 16.  HEENT:  The patient has dilated pupils, reactive to light.  Anicteric  sclerae.  No oral lesions.  NECK:  No nodes.  No JVD.  CHEST:  Chest is clear to auscultation bilaterally.  CARDIOVASCULAR:  Regular, S1 and S2.  No S3.  ABDOMEN:  Abdomen is soft and nontender.  No hepatosplenomegaly.  EXTREMITIES:  No cyanosis, clubbing or edema.  Two plus pulses.  NEUROLOGIC EXAMINATION: Grossly moves all extremities.  Overall just  somnolent.   LABORATORY DATA:  The patient has a BMET; sodium 136, potassium 40, chloride  101, carbon dioxide 33, BUN 13, creatinine 1.3, glucose 86, and calcium 9.0.  Urine drug screen is positive for benzodiazepine.   ASSESSMENT:  1. Questionable drug overdose secondary to benzodiazepines.  2. History of depression.  3. History of suicide attempt, June 2005, with Tylenol P.M.   RECOMMENDATIONS:  1. Patient to be admitted to a telemetry unit with a sitter.  2. I recommend checking an acetaminophen and Tylenol levels.  3. Chest a complete CMET.  4. Reconsult Behavioral Health/ACT Team in A.M. when the patient is more     alert.  5. Will make further recommendations after review of the above studies.                                               Deirdre Peer. Polite, M.D.    RDP/MEDQ  D:  12/02/2003  T:  12/03/2003  Job:  [id number]   cc:   Lanier Ensign, M.D.  [facility]

## 2010-10-22 NOTE — Op Note (Signed)
NAMECAMEREN, Patrick Herman             ACCOUNT NO.:  [id number]   MEDICAL RECORD NO.:  [id number]          PATIENT TYPE:  AMB   LOCATION:  SDS                          FACILITY:  [facility]   PHYSICIAN:  Artist Pais. Weingold, M.D.DATE OF BIRTH:  12/29/1975   DATE OF PROCEDURE:  DATE OF DISCHARGE:  12/28/2005                                 OPERATIVE REPORT   PREOPERATIVE DIAGNOSIS:  Displaced left ring finger proximal phalangeal  fracture.   POSTOPERATIVE DIAGNOSIS:  Displaced left ring finger proximal phalangeal  fracture.   PROCEDURE:  Closed reduction percutaneous pinning above using 0-45 K wires  x2.   SURGEON:  Artist Pais. Mina Marble, M.D.   ASSISTANT:  None.   ANESTHESIA:  General.   TOURNIQUET TIME:  21 minutes.   COMPLICATIONS:  None.   DRAINS:  None.   OPERATIVE REPORT:  The patient was taken to the Operating Room after the  induction of adequate general anesthesia the left upper extremities was  prepped and draped in the usual sterile fashion.  An Osteomark was used to  exsanguinate the limb tourniquet was insufflated to 150 mmHg at this point  and time.  Closed reduction was performed with longitudinal traction and two  0-45 K wires were driven from proximal to distal in cross fashion across the  fracture site.  Under fluoroscopic guidance the K wire was then cut outside  the skin bent upon themselves dressed with Xeroform, Marcaine 0.25% plain  was injected for postoperative pain control.  Patient was then placed in a  sterile dressing and 4x4s, fluffs, and ulnar guard splint.  Patient  tolerated procedure well.      Artist Pais Mina Marble, M.D.  Electronically Signed     MAW/MEDQ  D:  12/28/2005  T:  12/29/2005  Job:  [id number]

## 2010-10-22 NOTE — Discharge Summary (Signed)
NAME:  Patrick Herman, Patrick Herman                       ACCOUNT NO.:  [id number]   MEDICAL RECORD NO.:  [id number]                   PATIENT TYPE:  IPS   LOCATION:  0507                                 [facility]ACILITY[facility]  [facility]   PHYSICIAN:  Geoffery Lyons, M.D.                   DATE OF BIRTH:  22-Dec-1975   DATE OF ADMISSION:  11/22/2003  DATE OF DISCHARGE:  11/25/2003                                 DISCHARGE SUMMARY   CHIEF COMPLAINT AND PRESENT ILLNESS:  This was the first admission to [facility] for this 35 year old single white male voluntarily  admitted after intentional overdose on Tylenol PM, taking 30 tablets prior  to this admission.  Hospitalized for two days, medically cleared and  transferred for further psychiatric treatment.  He says he was not sure why  he overdosed or what he was hoping would happen after taking the medication.  Does endorse stressors of ex-wife, who would not let him see his children  for the past six months.  History of substance abuse, on methadone  maintenance.  Denies any active alcohol or any other substance use.   PAST PSYCHIATRIC HISTORY:  First time at [facility].  Sees Dr. Betti Cruz  at [facility].  On methadone maintenance.   SUBSTANCE ABUSE HISTORY:  History of opiate use, heroin.  Clean for the past  four months.  On maintenance treatment with methadone.   PAST MEDICAL HISTORY:  Noncontributory.   MEDICATIONS:  Methadone 100 mg daily, Klonopin 1 mg three times a day as  prescribed by Dr. Betti Cruz.   PHYSICAL EXAMINATION:  Performed and failed to show any acute findings.   LABORATORY DATA:  Blood chemistry within normal limits.  Liver profile  within normal limits.  Urine drug screen was positive for benzodiazepines.   MENTAL STATUS EXAM:  Alert, young male.  Cooperative.  Fair eye contact.  Speech was clear.  Mood was depressed and flat.  Thought processes were  coherent and logical.  Cognition seemed to be preserved.  Judgment is  intact.   Insight is very superficial.  Very poor historian.  Not very clear  about the events.  Vague on some others.   ADMISSION DIAGNOSES:   AXIS I:  1. Depressive disorder not otherwise specified.  2. Opiate dependence.   AXIS II:  No diagnosis.   AXIS III:  Status post Tylenol overdose.   AXIS IV:  Moderate.   AXIS V:  Global Assessment of Functioning upon admission 30; highest Global  Assessment of Functioning in the last year 65.   HOSPITAL COURSE:  He was admitted and started intensive individual and group  psychotherapy.  He was maintained on the methadone 100 mg daily and he was  maintained on the Klonopin 1 mg twice a day and at bedtime.  He was started  on Cymbalta 30 mg per day.  On November 24, 2003, she stated he was  trying to  feel better.  Father was supportive.  He was going to be staying with him.  He was encouraged.  Overall, was feeling better.  Mood stable.  Affect  bright, broad.  He was going to return to the methadone.  On November 25, 2003,  he was in full contact with reality.  No suicidal ideation.  No homicidal  ideation.  No hallucinations.  No delusions.  He was going back to the  methadone clinic.  He was going to stay with the father.  He felt he was  stable enough to go home.  We went ahead and discharged to outpatient follow-  up.   DISCHARGE DIAGNOSES:   AXIS I:  1. Opiate dependence.  2. Depressive disorder not otherwise specified.  3. Generalized anxiety disorder.   AXIS II:  No diagnosis.   AXIS III:  Status post Tylenol overdose.   AXIS IV:  Moderate.   AXIS V:  Global Assessment of Functioning upon discharge 55-60.   DISCHARGE MEDICATIONS:  1. Trazodone 100 mg, 1/2-1 at night as needed.  2. Methadone 100 mg daily.  3. Klonopin 1 mg three times a day.  4. Cymbalta 30 mg daily.  5. Ultram 50 mg, 1 every six hours as needed for breakthrough pain for the     next couple of days.  6. Seroquel 25 mg, 1 three times a day as needed for anxiety.  7.  Protonix 40 mg per day.   FOLLOW UP:  [facility], methadone clinic and Dr. Betti Cruz.                                               Geoffery Lyons, M.D.    IL/MEDQ  D:  12/12/2003  T:  12/13/2003  Job:  [id number]

## 2010-10-22 NOTE — Discharge Summary (Signed)
NAME:  Patrick Herman, Patrick Herman                       ACCOUNT NO.:  [id number]   MEDICAL RECORD NO.:  [id number]                   PATIENT TYPE:  INP   LOCATION:  0371                                 FACILITY:  [facility]   PHYSICIAN:  Jackie Plum, M.D.             DATE OF BIRTH:  [birth year]   DATE OF ADMISSION:  11/20/2003  DATE OF DISCHARGE:  11/22/2003                                 DISCHARGE SUMMARY   DISCHARGE DIAGNOSES:  1. Suicide attempt.  2. Drug overdose.  3. Rib fractures (x-ray of the ribs, i.e., rib series, done on November 20, 2003     notable for minimally displaced fractures involving the right 6th and 7th     ribs, no pneumothorax).     a. The patient is going to get rib straps and will be on analgesic        medication and will need outpatient followup per pulmonary medicine.  4. History of depression.  5. Right inguinal hernia.   MEDICATIONS ON DISCHARGE:  1. Protonix 40 mg p.o. daily.  2. Celexa 5 mg p.o. daily.  3. Klonopin 5 mg p.o. t.i.d.  4. Methadone 100 mg p.o. daily.  5. Trazodone 25 mg 1 or 2 tablets p.o. nightly p.r.n.  6. Ultram 50 mg 1 or 2 tablets p.o. q.4 h. p.r.n.   DISCHARGE LABORATORIES:  WBC count 7.5, hemoglobin 13.0, hematocrit 38.0,  MCV 86.8, platelet count 265,000.  Pro time 13.0, INR 1.0, PTT 32.  Sodium  159, potassium 4.3, chloride 105, CO2 27, glucose 87, BUN 8, creatinine 1.0,  total bilirubin 0.6, alkaline phosphatase 74, SGOT 21, SGPT 10, total  protein 6.8, albumin 3.8, calcium 9.2.  Acetaminophen level less than 10.0.  Salicylate level less than 4.0.  Drug screen test positive for  benzodiazepine.  Alcohol less than 5.   CONSULTANTS:  Dr. Geoffery Lyons has yet to do a psychiatric evaluation on this  patient to decide whether this patient will need inpatient hospitalization  and making appropriate adjustment in the patient's psychiatric medications.   DISPOSITION:  Dr. Dub Mikes.   DIET:  Diet will be a regular diet.   ACTIVITY:   Activity as tolerated.   REASON FOR HOSPITALIZATION:  Drug overdose/suicide attempt.  Please see  admission H&P by Dr. Isla Pence for full insight into the patient's  presenting symptoms and signs.  The patient was brought into the ED on  account of ingestion of about 80 tablets of Tylenol PM.  He took these  medications because he was having some problems with his wife.  He suspected  that another man is having an affair with his wife.  At the ED, the patient  had to be intubated apparently because of hypersomnolence to protect his  airways.  He subsequently self-extubated apparently and was subsequently  admitted to the medical service for further evaluation and observed prior to  clearance medically.  According to admission H&P by  Dr. Frederico Hamman,  admission BP was 159/77, pulse 89, respiratory rate 24.  Temperature was  98.5 degrees Fahrenheit.  Lungs were clear to auscultation.  Cardiac exam  was unremarkable.  Abdomen was thought to be soft and nontender.  He had  right inguinal hernia repair.  His neurologic testing was said to be  negative without any gross focal deficits.  He was therefore admitted to the  hospital for observation, in view of his drug overdose and medical  clearance.   COURSE IN THE HOSPITAL:  The patient was admitted to the hospitalist's  service on telemetry monitoring.  There were no significant arrhythmias.  The patient received other supportive measures including IV fluids and  analgesics for his rib pain with significant improvement.  His electrolytes,  i.e., hypokalemia was repleted.  Overnight, the patient is significantly  improved.  He is very alert and oriented, however, he seemed to be still  suicidal.  On rounds, he denies any chest pain, fever or chills.  His vital  signs are notable for BP of 123/62, pulse of 56, respiratory rate of 20,  temperature 98.3 degrees Fahrenheit.  He does not have any JVD.  Mucous  membranes are moist.  Lungs  are clear to auscultation.  Cardiac exam is  notable for regular rate and rhythm without any gallops or murmur.  Abdomen  is soft and nontender.  He has a right lower rib pain on palpation.  He does  not have any edema.  The patient has yet to be evaluated by psychiatric  service for further recommendation regarding his psychiatric care and needs.                                               Jackie Plum, M.D.    GO/MEDQ  D:  11/22/2003  T:  11/22/2003  Job:  [id number]   cc:   Geoffery Lyons, M.D.   Daine Floras, M.D.  [address]  [place]  [place] [address]  Fax: [phone]

## 2010-10-22 NOTE — H&P (Signed)
NAME:  Patrick Herman, Patrick Herman                       ACCOUNT NO.:  [id number]   MEDICAL RECORD NO.:  [id number]                   PATIENT TYPE:  IPS   LOCATION:  0507                                 FACILITY:  [facility]   PHYSICIAN:  Geoffery Lyons, M.D.                   DATE OF BIRTH:  Mar 04, 1976   DATE OF ADMISSION:  11/22/2003  DATE OF DISCHARGE:                         PSYCHIATRIC ADMISSION ASSESSMENT   IDENTIFYING INFORMATION:  The patient is a 35 year old single white male  voluntarily admitted on November 22, 2003.   HISTORY OF PRESENT ILLNESS:  The patient presents on intentional overdose on  Tylenol P.M., taking approximately 30 tablets on Thursday prior to this  admission.  The patient was hospitalized for two days, medically cleared,  and transferred here for further psychiatric evaluation.  The patient states  he overdosed at home.  He is not sure why he did it or what he was hoping  would happen with taking the medications.  He is not sure what happened  after the overdose.  The patient reports stressors of his ex-wife will not  let him see his children for the past six months.  He has a history of  substance abuse and is currently on methadone.  He denies any alcohol or any  other drug use and is denying any suicidal or homicidal thoughts.   PAST PSYCHIATRIC HISTORY:  This is his first admission.  He sees Daine Floras, M.D., at [facility].  No prior suicide attempt.   SUBSTANCE ABUSE HISTORY:  The patient smokes.  He denies any alcohol or drug  use.  The patient does have a history of opiate use, using heroin.  He has  been clean for the past four months.  He denies any IV drug use.   PAST MEDICAL HISTORY:  Primary care Ezmae Speers: None.  Medical problems: None.   MEDICATIONS:  1. He has been on methadone 100 mg daily.  2. Klonopin 1 mg t.i.d. prescribed by Daine Floras, M.D.   DRUG ALLERGIES:  No known allergies.   PHYSICAL EXAMINATION:  GENERAL:  The patient was assessed at  [facility]  where he was hospitalized after his Tylenol overdose.  He is in no acute  distress today.  He does have a tattoo noted to his neck.  VITAL SIGNS:  Temperature 98.5, heart rate 89, blood pressure 139/77.  He is  5 feet 10 inches tall.   LABORATORY DATA:  Urine drug screen was positive for benzodiazepines.  Salicylate level was less than 4.  Alcohol level less than 5.  Acetaminophen  level on June 17 was less than 10.   SOCIAL HISTORY:  He is a 35 year old separated white male.  He has two  children ages 49 and 65.  Children are in [place].  The patient reports  having a total of six children.  He lives with his girlfriend.  He works  Publishing rights manager houses.  No legal charges.   FAMILY HISTORY:  On his mother side, he reports depression.   MENTAL STATUS EXAM:  Alert young male, cooperative, fair eye contact.  Speech is clear.  Mood is depressed and flat.  Thought processes are  coherent and logical.  There appears to be some medication-seeking behavior.  Cognitive functioning: Intact.  Memory is fair.  Judgment and insight are  fair.  He is a poor historian.   ADMISSION DIAGNOSES:   AXIS I:  Major depressive disorder.   AXIS II:  Deferred.   AXIS III:  Status post Tylenol overdose.   AXIS IV:  Problems with primary support group, other psychosocial problems.   AXIS V:  Current is 30, this past year is 80.   INITIAL PLAN OF CARE:  Plan is an admission to [facility] for  intentional overdose.  Contract for safety.  Stabilize mood and thinking.  Will resume his Klonopin.  The patient is to increase coping skills, attend  groups.  The patient is to follow up with Daine Floras, M.D., for  methadone management.  Family session with girlfriend prior to discharge for  concerns and support.  We will initiate an antidepressant to help out the  patient with symptoms of depression.   ESTIMATED LENGTH OF STAY:  Four to six days.     Patrick Herman, N.P.                        Geoffery Lyons, M.D.    JO/MEDQ  D:  11/24/2003  T:  11/24/2003  Job:  [id number]

## 2010-11-04 ENCOUNTER — Emergency Department (HOSPITAL_COMMUNITY)
Admission: EM | Admit: 2010-11-04 | Discharge: 2010-11-04 | Disposition: A | Discharge status: Home / Self Care | Payer: Self-pay | Attending: Emergency Medicine | Admitting: Emergency Medicine

## 2010-11-04 ENCOUNTER — Emergency Department (HOSPITAL_COMMUNITY): Payer: Self-pay

## 2010-11-04 DIAGNOSIS — S9030XA Contusion of unspecified foot, initial encounter: Secondary | ICD-10-CM | POA: Insufficient documentation

## 2010-11-04 DIAGNOSIS — M79609 Pain in unspecified limb: Secondary | ICD-10-CM | POA: Insufficient documentation

## 2010-11-04 DIAGNOSIS — M7989 Other specified soft tissue disorders: Secondary | ICD-10-CM | POA: Insufficient documentation

## 2010-11-04 DIAGNOSIS — W010XXA Fall on same level from slipping, tripping and stumbling without subsequent striking against object, initial encounter: Secondary | ICD-10-CM | POA: Insufficient documentation

## 2010-11-04 DIAGNOSIS — R209 Unspecified disturbances of skin sensation: Secondary | ICD-10-CM | POA: Insufficient documentation

## 2010-11-16 ENCOUNTER — Emergency Department (HOSPITAL_COMMUNITY)
Admission: EM | Admit: 2010-11-16 | Discharge: 2010-11-17 | Disposition: A | Discharge status: Other Acute Inpatient Hospital | Payer: Self-pay | Attending: Emergency Medicine | Admitting: Emergency Medicine

## 2010-11-16 DIAGNOSIS — F1910 Other psychoactive substance abuse, uncomplicated: Secondary | ICD-10-CM | POA: Insufficient documentation

## 2010-11-16 DIAGNOSIS — Z139 Encounter for screening, unspecified: Secondary | ICD-10-CM | POA: Insufficient documentation

## 2010-11-16 LAB — CBC
Hemoglobin: 14.8 g/dL (ref 13.0–17.0)
MCH: 30.9 pg (ref 26.0–34.0)
Platelets: 213 10*3/uL (ref 150–400)
RBC: 4.79 MIL/uL (ref 4.22–5.81)
WBC: 11.4 10*3/uL — ABNORMAL HIGH (ref 4.0–10.5)

## 2010-11-16 LAB — BASIC METABOLIC PANEL
CO2: 23 mEq/L (ref 19–32)
Chloride: 100 mEq/L (ref 96–112)
Creatinine, Ser: 1.22 mg/dL (ref 0.4–1.5)
GFR calc Af Amer: >60 mL/min (ref >60)
Sodium: 138 mEq/L (ref 135–145)

## 2010-11-16 LAB — URINALYSIS, ROUTINE W REFLEX MICROSCOPIC
Bilirubin Urine: NEGATIVE
Ketones, ur: NEGATIVE mg/dL
Leukocytes, UA: NEGATIVE
Nitrite: NEGATIVE
Urobilinogen, UA: 1 mg/dL (ref 0.0–1.0)
pH: 5 (ref 5.0–8.0)

## 2010-11-16 LAB — ETHANOL: Alcohol, Ethyl (B): 15 mg/dL — ABNORMAL HIGH (ref 0–10)

## 2010-11-16 LAB — DIFFERENTIAL
Basophils Relative: 1 % (ref 0–1)
Eosinophils Relative: 1 % (ref 0–5)
Lymphocytes Relative: 61 % — ABNORMAL HIGH (ref 12–46)
Monocytes Absolute: 0.6 10*3/uL (ref 0.1–1.0)
Monocytes Relative: 5 % (ref 3–12)
Neutrophils Relative %: 32 % — ABNORMAL LOW (ref 43–77)

## 2010-11-16 LAB — RAPID URINE DRUG SCREEN, HOSP PERFORMED
Barbiturates: NOT DETECTED
Benzodiazepines: NOT DETECTED

## 2010-11-17 ENCOUNTER — Inpatient Hospital Stay (HOSPITAL_COMMUNITY)
Admission: AD | Admit: 2010-11-17 | Discharge: 2010-11-24 | DRG: 897 | Disposition: A | Discharge status: Home / Self Care | Payer: PRIVATE HEALTH INSURANCE | Source: Ambulatory Visit | Attending: Psychiatry | Admitting: Psychiatry

## 2010-11-17 DIAGNOSIS — R45850 Homicidal ideations: Secondary | ICD-10-CM

## 2010-11-17 DIAGNOSIS — F1120 Opioid dependence, uncomplicated: Principal | ICD-10-CM

## 2010-11-17 DIAGNOSIS — F39 Unspecified mood [affective] disorder: Secondary | ICD-10-CM

## 2010-11-17 DIAGNOSIS — R45851 Suicidal ideations: Secondary | ICD-10-CM

## 2010-11-17 DIAGNOSIS — Z560 Unemployment, unspecified: Secondary | ICD-10-CM

## 2010-11-17 DIAGNOSIS — F1210 Cannabis abuse, uncomplicated: Secondary | ICD-10-CM

## 2010-11-17 LAB — PATHOLOGIST SMEAR REVIEW

## 2010-11-23 NOTE — H&P (Signed)
Patrick Herman, Patrick Herman             ACCOUNT NO.:  [id number]  MEDICAL RECORD NO.:  [id number]  LOCATION:  0302                          FACILITY:  [facility]  PHYSICIAN:  Eulogio Ditch, MD DATE OF BIRTH:  May 06, 1976  DATE OF ADMISSION:  11/17/2010 DATE OF DISCHARGE:                      PSYCHIATRIC ADMISSION ASSESSMENT   This is a voluntary admission to his services.  This is a 35 year old divorced white male.  He presented to [facility] requesting detox from opiates.  He states he has been using them for 17 years.  He takes pills; whatever he can get.  He last used  this morning.  He states he sometimes has suicidal and sometimes homicidal ideation. According to the record and verified with him, he was actually here when this facility was [facility] about 10 years ago for the same type of treatment.  He has had no other formal psych care according to him.  SOCIAL HISTORY:  He has a GED.  Married and divorced once.  He has a 35- year-old son, a 33-year-old son, and a 74-year-old daughter.  He is not employed for the past 1-2 months.  He used to be an Journalist, newspaper.  He is currently living with his mother.  FAMILY HISTORY:  He denies alcohol and drug history.  He has had issues with alcohol and drugs for at least 10 years.  On his intake, it states that he started using when he was in his teens.  PRIMARY CARE PROVIDER:  He denies having one.  MEDICAL PROBLEMS:  None are known.  MEDICATIONS:  None are prescribed.  DRUG ALLERGIES:  NO KNOWN DRUG ALLERGIES.  POSITIVE PHYSICAL FINDINGS:  He was medically cleared in the ED at [facility].  He was afebrile.  His temperature was 97.4 to 98.6.  His pulse was 66-112, respirations 16-18.  Blood pressure was 111/75 to 141/89.  His UDS was positive for opiates as well as marijuana.  He had an alcohol level of 15 and a slightly elevated glucose at 122.  He had no other remarkable findings.  MENTAL STATUS EXAM:  He is alert and  oriented.  He is appropriately albeit casually groomed and dressed.  His physical appearance is notable for a teardrop at the left side of his left eye.  He has other tattoos as well.  His speech had a normal rate, rhythm and tone.  His mood was appropriate to the situation.  Thought processes are clear, rational and goal oriented.  He states it is time to "straighten up."  Judgment and insight are fair.  Concentration and memory are intact at least superficially and intelligence is average.  He denies being suicidal or homicidal at this time.  He denies any auditory or visual hallucinations.  DIAGNOSES:  Axis I:  Opioid dependence, THC abuse. AXIS II:  Deferred. AXIS III:  None known. AXIS IV:  Problems with primary support group, economics and he has an upcoming court date December 06, 2010, for breaking and entering. AXIS V:  46.  PLAN:  To admit for safety and stabilization.  We will help him detox from opioids through use of the clonidine protocol.  The need for antidepressants will be assessed and instituted if  indicated and discharge placement will be made through the case manager and we will have contact with his mother.     Mickie Leonarda Salon, P.A.-C.   ______________________________ Eulogio Ditch, MD    MD/MEDQ  D:  11/17/2010  T:  11/17/2010  Job:  [id number]  Electronically Signed by Jaci Lazier ADAMS P.A.-C. on 11/18/2010 07:35:17 PM Electronically Signed by Eulogio Ditch  on 11/23/2010 10:03:27 AM

## 2010-11-25 NOTE — Discharge Summary (Signed)
Patrick Herman, [name]:  [id number]  MEDICAL RECORD NO.:  [id number]  LOCATION:  0302                          FACILITY:  [facility]  PHYSICIAN:  Patrick Lacrosse, MD       DATE OF BIRTH:  1976/01/10  DATE OF ADMISSION:  11/17/2010 DATE OF DISCHARGE:  11/24/2010                              DISCHARGE SUMMARY   This discharge summary for this 35 year old divorced white male who presented to [facility] requesting detox from opiates.  He had been using for 17 years, taking pills whenever he could get them, last used on the morning of admission which was November 17, 2010.  He states that he sometimes has suicidal and homicidal thinking, becomes quite agitated and irritable with people, and gets himself into trouble.  He has had numerous treatments in the past for substance treatment.  He denies any formal psychiatric care in the past.  He was admitted to the inpatient unit on November 17, 2010.  PERTINENT LABORATORY STUDIES:  The patient had a complete blood count panel which was essentially normal.  He has slight elevation of white count at 11.4, not felt to be significant.  His BMP was normal.  His alcohol level on admission to the emergency room on November 16, 2010 was 15.  Drug screen was positive for opiates and cannabis.  Urinalysis essentially negative.  FINAL DIAGNOSES:  Axis I:  Opioid dependence, cannabis abuse, mood disorder, not otherwise specified. Axis II:  Deferred, antisocial traits. Axis III:  Not known. Axis IV:  support, social, occupation, economic, legal. Axis V:  50.  SIGNIFICANT FINDINGS:  The patient participated fairly well in the hospital.  He started on medications, Celexa and trazodone.  On the evening of November 22, 2010 the patient was quite agitated and requested something for agitation and irritability.  He received Geodon 20 mg which seemed to help but also he had Ativan 1 mg.  He also started this routinely on November 23, 2010 and felt  that this was helping calm his agitation and irritability.  PROCEDURES AND TREATMENT RENDERED:  As noted above.  Also, the patient was started on clonidine taper for detoxing from heroin.  He responded well to this.  Vital signs were stable.  He did have any difficulties coming off of heroin.  Also during this admission he had evaluation by the treatment team with plans to continue his substance abuse treatment at the [facility] and this helped him to get into a longer program.  The plan was to discharge on the morning of November 24, 2010 with [facility] taking the patient for further evaluation.  CONDITION OF PATIENT:  Stable.  No dangerousness.  No acute suicidal or homicidal ideation.  No acute psychosis.  INSTRUCTIONS GIVEN TO THE PATIENT:  Go to [facility].  Continue with current medications.  Continue on the Geodon, Celexa and trazodone.  The patient has completed a clonidine taper for heroin.  Physical activity, diet, follow up all previously arranged.  No restrictions in activity or diet.  The patient will have follow up arranged through [facility].  DISCHARGE MEDICATIONS:  As noted, Protonix 40 mg daily, citalopram 20 mg daily, trazodone 150 mg  at bedtime, Celebrex 100 mg twice a day, loratadine 10 mg daily, ziprasidone (Geodon) 20 mg twice a day.          ______________________________ Patrick Lacrosse, MD     WS/MEDQ  D:  11/24/2010  T:  11/24/2010  Job:  [id number]  Electronically Signed by Patrick Herman  on 11/25/2010 02:20:30 PM

## 2011-02-24 LAB — POCT I-STAT CREATININE
Creatinine, Ser: 1.1
Operator id: 198171

## 2011-02-24 LAB — HEMOGLOBIN AND HEMATOCRIT, BLOOD: Hemoglobin: 13.9

## 2011-02-24 LAB — TYPE AND SCREEN: ABO/RH(D): O NEG

## 2011-02-24 LAB — CBC
HCT: 36.6 — ABNORMAL LOW
HCT: 37.7 — ABNORMAL LOW
HCT: 38.2 — ABNORMAL LOW
HCT: 38.3 — ABNORMAL LOW
HCT: 40.2
HCT: 40.5
Hemoglobin: 12.7 — ABNORMAL LOW
Hemoglobin: 12.7 — ABNORMAL LOW
Hemoglobin: 12.9 — ABNORMAL LOW
Hemoglobin: 13.1
Hemoglobin: 13.1
Hemoglobin: 13.1
Hemoglobin: 13.3
Hemoglobin: 15.3
MCHC: 34.2
MCHC: 34.6
MCHC: 34.6
MCHC: 34.6
MCHC: 34.7
MCHC: 34.7
MCHC: 34.9
MCHC: 35.6
MCV: 89.9
MCV: 90.8
MCV: 91.1
MCV: 91.2
MCV: 91.4
MCV: 91.7
MCV: 91.8
MCV: 92.1
Platelets: 215
Platelets: 218
Platelets: 222
Platelets: 224
Platelets: 224
Platelets: 229
Platelets: 255
Platelets: 263
Platelets: 263
Platelets: 272
RBC: 3.98 — ABNORMAL LOW
RBC: 4.16 — ABNORMAL LOW
RBC: 4.18 — ABNORMAL LOW
RBC: 4.41
RBC: 4.94
RDW: 12.4
RDW: 12.4
RDW: 12.6
RDW: 12.7
RDW: 12.7
RDW: 12.8
RDW: 12.9
WBC: 10
WBC: 11.8 — ABNORMAL HIGH
WBC: 5.1
WBC: 5.7
WBC: 5.8
WBC: 7.1
WBC: 7.4
WBC: 9
WBC: 9.4

## 2011-02-24 LAB — BASIC METABOLIC PANEL
BUN: 6
Chloride: 101
GFR calc Af Amer: >60
GFR calc non Af Amer: >60
Glucose, Bld: 163 — ABNORMAL HIGH
Potassium: 3.8
Potassium: 3.8

## 2011-02-24 LAB — DIFFERENTIAL
Basophils Relative: 0
Eosinophils Absolute: 0
Eosinophils Absolute: 0
Eosinophils Relative: 0
Eosinophils Relative: 0
Lymphs Abs: 0.5 — ABNORMAL LOW
Lymphs Abs: 0.8
Monocytes Relative: 8

## 2011-02-24 LAB — I-STAT 8, (EC8 V) (CONVERTED LAB)
Acid-Base Excess: 1
Bicarbonate: 28.1 — ABNORMAL HIGH
Operator id: 198171
TCO2: 30
pCO2, Ven: 50.1 — ABNORMAL HIGH
pH, Ven: 7.356 — ABNORMAL HIGH

## 2011-02-24 LAB — PROTIME-INR
INR: 1
Prothrombin Time: 12.9

## 2011-02-25 LAB — URINALYSIS, ROUTINE W REFLEX MICROSCOPIC
Bilirubin Urine: NEGATIVE
Hgb urine dipstick: NEGATIVE
Nitrite: NEGATIVE
Protein, ur: NEGATIVE
Urobilinogen, UA: 0.2

## 2011-02-25 LAB — CBC
HCT: 41
Hemoglobin: 14.6
Hemoglobin: 14.1
MCHC: 35.2
MCV: 89.2
MCV: 90.9
Platelets: 352
RBC: 4.23
RBC: 4.65
RBC: 4.5
WBC: 7
WBC: 8.5
WBC: 13.4 — ABNORMAL HIGH

## 2011-02-25 LAB — DIFFERENTIAL
Basophils Absolute: 0
Basophils Absolute: 0
Basophils Relative: 0
Eosinophils Absolute: 0.2
Eosinophils Relative: 2
Lymphocytes Relative: 17
Lymphocytes Relative: 32
Lymphs Abs: 1.4
Lymphs Abs: 2.3
Monocytes Absolute: 0.6
Monocytes Relative: 6
Neutro Abs: 11.1 — ABNORMAL HIGH
Neutrophils Relative %: 53
Neutrophils Relative %: 77
Neutrophils Relative %: 83 — ABNORMAL HIGH

## 2011-02-25 LAB — COMPREHENSIVE METABOLIC PANEL
ALT: 10
AST: 17
Albumin: 4.4
Alkaline Phosphatase: 48
BUN: 12
CO2: 28
CO2: 29
Calcium: 9.3
Chloride: 100
Chloride: 96
Creatinine, Ser: 1.06
Creatinine, Ser: 1.1
GFR calc Af Amer: >60
GFR calc non Af Amer: >60
GFR calc non Af Amer: >60
Glucose, Bld: 94
Glucose, Bld: 81
Potassium: 3.6
Total Bilirubin: 0.8
Total Bilirubin: 0.7

## 2011-02-25 LAB — RAPID URINE DRUG SCREEN, HOSP PERFORMED
Amphetamines: NOT DETECTED
Barbiturates: NOT DETECTED
Tetrahydrocannabinol: NOT DETECTED

## 2011-02-25 LAB — MAGNESIUM: Magnesium: 2.8 — ABNORMAL HIGH

## 2011-02-25 LAB — ETHANOL: Alcohol, Ethyl (B): <5

## 2011-03-02 LAB — CBC
HCT: 39.7
MCHC: 33.8
MCV: 87.4
Platelets: 207
RDW: 12.4

## 2011-03-02 LAB — DIFFERENTIAL
Basophils Relative: 0
Eosinophils Absolute: 0.5
Eosinophils Relative: 6 — ABNORMAL HIGH
Neutrophils Relative %: 61

## 2011-03-02 LAB — RAPID URINE DRUG SCREEN, HOSP PERFORMED
Cocaine: NOT DETECTED
Opiates: NOT DETECTED

## 2011-03-02 LAB — BASIC METABOLIC PANEL
BUN: 12
CO2: 31
Chloride: 105
Creatinine, Ser: 1.14
Glucose, Bld: 101 — ABNORMAL HIGH
Potassium: 4.3

## 2011-03-09 LAB — COMPREHENSIVE METABOLIC PANEL
ALT: 11
AST: 24
Albumin: 4.4
CO2: 29
Calcium: 9.6
Chloride: 99
Creatinine, Ser: 1.2
GFR calc Af Amer: >60
GFR calc non Af Amer: >60
Sodium: 137
Total Bilirubin: 0.7

## 2011-03-09 LAB — DIFFERENTIAL
Eosinophils Absolute: 0.2
Eosinophils Relative: 2
Lymphocytes Relative: 17
Lymphs Abs: 1.5
Monocytes Absolute: 0.3

## 2011-03-09 LAB — URINALYSIS, ROUTINE W REFLEX MICROSCOPIC
Bilirubin Urine: NEGATIVE
Hgb urine dipstick: NEGATIVE
Ketones, ur: NEGATIVE
Specific Gravity, Urine: 1.019
pH: 7.5

## 2011-03-09 LAB — CBC
MCV: 88.1
Platelets: 269
RBC: 5.21
WBC: 9.1

## 2011-03-10 ENCOUNTER — Emergency Department (HOSPITAL_COMMUNITY)
Admission: EM | Admit: 2011-03-10 | Discharge: 2011-03-10 | Disposition: A | Discharge status: Home / Self Care | Payer: Self-pay | Attending: Emergency Medicine | Admitting: Emergency Medicine

## 2011-03-10 DIAGNOSIS — IMO0002 Reserved for concepts with insufficient information to code with codable children: Secondary | ICD-10-CM | POA: Insufficient documentation

## 2011-03-16 LAB — CBC
HCT: 42.1
Hemoglobin: 14.8
MCHC: 35.1
MCV: 90.4
RBC: 4.66
WBC: 9.3

## 2011-03-16 LAB — DIFFERENTIAL
Basophils Absolute: 0.1
Eosinophils Relative: 4
Lymphocytes Relative: 28
Neutrophils Relative %: 62

## 2011-03-16 LAB — URINALYSIS, ROUTINE W REFLEX MICROSCOPIC
Ketones, ur: NEGATIVE
Nitrite: NEGATIVE
Protein, ur: NEGATIVE
pH: 6.5

## 2011-03-16 LAB — COMPREHENSIVE METABOLIC PANEL
AST: 19
BUN: 18
CO2: 27
Chloride: 103
Creatinine, Ser: 1.17
GFR calc non Af Amer: >60
Glucose, Bld: 91
Total Bilirubin: 0.8

## 2011-03-17 LAB — BASIC METABOLIC PANEL
BUN: 11
Creatinine, Ser: 1.06
GFR calc Af Amer: >60
GFR calc non Af Amer: >60
Potassium: 3.6

## 2011-03-17 LAB — DIFFERENTIAL
Lymphocytes Relative: 32
Lymphs Abs: 2.4
Neutrophils Relative %: 55

## 2011-03-17 LAB — CBC
Platelets: 237
WBC: 7.6

## 2016-11-28 ENCOUNTER — Ambulatory Visit (INDEPENDENT_AMBULATORY_CARE_PROVIDER_SITE_OTHER): Payer: Self-pay | Admitting: Physician Assistant

## 2016-12-19 ENCOUNTER — Ambulatory Visit (INDEPENDENT_AMBULATORY_CARE_PROVIDER_SITE_OTHER): Payer: Self-pay | Admitting: Physician Assistant

## 2016-12-19 ENCOUNTER — Encounter (INDEPENDENT_AMBULATORY_CARE_PROVIDER_SITE_OTHER): Payer: Self-pay | Admitting: Physician Assistant

## 2016-12-19 VITALS — BP 154/73 | HR 72 | Temp 98.0°F | Ht 70.0 in | Wt 174.6 lb

## 2016-12-19 DIAGNOSIS — F411 Generalized anxiety disorder: Secondary | ICD-10-CM

## 2016-12-19 MED ORDER — CLONAZEPAM 0.5 MG PO TABS: 0.5000 mg | ORAL_TABLET | Freq: Every day | ORAL | 0 refills | Status: AC

## 2016-12-19 MED ORDER — ESCITALOPRAM OXALATE 10 MG PO TABS: 10.0000 mg | ORAL_TABLET | Freq: Every day | ORAL | 2 refills | Status: AC

## 2016-12-19 NOTE — Patient Instructions (Addendum)
Living With Anxiety After being diagnosed with an anxiety disorder, you may be relieved to know why you have felt or behaved a certain way. It is natural to also feel overwhelmed about the treatment ahead and what it will mean for your life. With care and support, you can manage this condition and recover from it. How to cope with anxiety Dealing with stress Stress is your body's reaction to life changes and events, both good and bad. Stress can last just a few hours or it can be ongoing. Stress can play a major role in anxiety, so it is important to learn both how to cope with stress and how to think about it differently. Talk with your health care provider or a counselor to learn more about stress reduction. He or she may suggest some stress reduction techniques, such as:  Music therapy. This can include creating or listening to music that you enjoy and that inspires you.  Mindfulness-based meditation. This involves being aware of your normal breaths, rather than trying to control your breathing. It can be done while sitting or walking.  Centering prayer. This is a kind of meditation that involves focusing on a word, phrase, or sacred image that is meaningful to you and that brings you peace.  Deep breathing. To do this, expand your stomach and inhale slowly through your nose. Hold your breath for 3-5 seconds. Then exhale slowly, allowing your stomach muscles to relax.  Self-talk. This is a skill where you identify thought patterns that lead to anxiety reactions and correct those thoughts.  Muscle relaxation. This involves tensing muscles then relaxing them.  Choose a stress reduction technique that fits your lifestyle and personality. Stress reduction techniques take time and practice. Set aside 5-15 minutes a day to do them. Therapists can offer training in these techniques. The training may be covered by some insurance plans. Other things you can do to manage stress include:  Keeping a  stress diary. This can help you learn what triggers your stress and ways to control your response.  Thinking about how you respond to certain situations. You may not be able to control everything, but you can control your reaction.  Making time for activities that help you relax, and not feeling guilty about spending your time in this way.  Therapy combined with coping and stress-reduction skills provides the best chance for successful treatment. Medicines Medicines can help ease symptoms. Medicines for anxiety include:  Anti-anxiety drugs.  Antidepressants.  Beta-blockers.  Medicines may be used as the main treatment for anxiety disorder, along with therapy, or if other treatments are not working. Medicines should be prescribed by a health care provider. Relationships Relationships can play a big part in helping you recover. Try to spend more time connecting with trusted friends and family members. Consider going to couples counseling, taking family education classes, or going to family therapy. Therapy can help you and others better understand the condition. How to recognize changes in your condition Everyone has a different response to treatment for anxiety. Recovery from anxiety happens when symptoms decrease and stop interfering with your daily activities at home or work. This may mean that you will start to:  Have better concentration and focus.  Sleep better.  Be less irritable.  Have more energy.  Have improved memory.  It is important to recognize when your condition is getting worse. Contact your health care provider if your symptoms interfere with home or work and you do not feel like your condition   is improving. Where to find help and support: You can get help and support from these sources:  Self-help groups.  Online and community organizations.  A trusted spiritual leader.  Couples counseling.  Family education classes.  Family therapy.  Follow these  instructions at home:  Eat a healthy diet that includes plenty of vegetables, fruits, whole grains, low-fat dairy products, and lean protein. Do not eat a lot of foods that are high in solid fats, added sugars, or salt.  Exercise. Most adults should do the following: ? Exercise for at least 150 minutes each week. The exercise should increase your heart rate and make you sweat (moderate-intensity exercise). ? Strengthening exercises at least twice a week.  Cut down on caffeine, tobacco, alcohol, and other potentially harmful substances.  Get the right amount and quality of sleep. Most adults need 7-9 hours of sleep each night.  Make choices that simplify your life.  Take over-the-counter and prescription medicines only as told by your health care provider.  Avoid caffeine, alcohol, and certain over-the-counter cold medicines. These may make you feel worse. Ask your pharmacist which medicines to avoid.  Keep all follow-up visits as told by your health care provider. This is important. Questions to ask your health care provider  Would I benefit from therapy?  How often should I follow up with a health care provider?  How long do I need to take medicine?  Are there any long-term side effects of my medicine?  Are there any alternatives to taking medicine? Contact a health care provider if:  You have a hard time staying focused or finishing daily tasks.  You spend many hours a day feeling worried about everyday life.  You become exhausted by worry.  You start to have headaches, feel tense, or have nausea.  You urinate more than normal.  You have diarrhea. Get help right away if:  You have a racing heart and shortness of breath.  You have thoughts of hurting yourself or others. If you ever feel like you may hurt yourself or others, or have thoughts about taking your own life, get help right away. You can go to your nearest emergency department or call:  Your local emergency  services (911 in the [place]  A suicide crisis helpline, such as the National Suicide Prevention Lifeline at 1-800-273-8255. This is open 24-hours a day.  Summary  Taking steps to deal with stress can help calm you.  Medicines cannot cure anxiety disorders, but they can help ease symptoms.  Family, friends, and partners can play a big part in helping you recover from an anxiety disorder. This information is not intended to replace advice given to you by your health care provider. Make sure you discuss any questions you have with your health care provider. Document Released: 05/17/2016 Document Revised: 05/17/2016 Document Reviewed: 05/17/2016 Elsevier Interactive Patient Education  2018 Elsevier Inc.    Community Resources  Advocacy/Legal Legal Aid [place]:  1-866-219-5262  /  336-272-0148  Family Justice Center:  336-641-7233  [facility] [place] 24-hr Crisis line:  336-273-7273  Women's Resource Center, [place]:  336-275-6090  Court Watch (custody):  336-275-2346  [place] [facility]:   336-279-9299    Baby & Breastfeeding Car Seat Inspection @ Various [place] Fire Depts.- call 336-373-2177  [place] [facility]  336-832-6860  [facility] 336-878-6712  WIC: 336-641-3663 ([place]);  336-641-7571 ([place])  La Leche League:  1-877-452-5321   Childcare [place] [facility]: 336-369-5097 ([place]) / 336-887-8224 ([place])  -   Child Care Resources/ Referrals/ Scholarships  - Head Start/ Early Head Start (call or apply online)  [place] DHHS: [place] Pre-K :  1-800-859-0829 / 336-274-5437   Employment / Job Search [facility] [place]: 336-275-6090 / [address]  [place] [facility] [employer] [facility] (JobLink): 336-373-5922 ([place]) / 336-882-4141 ([place])  [facility] Community Resource/ Career Center: 336-275-9801 / 336-282-7307  [facility] Job & Career Center: 336-373-3764  DHHS Work First: 336-641-3447 ([place]) / 336-641-3447 ([place])  [facility]  [place]:  336-676-5871   Financial Assistance [facility]:  336-553-2657  [employer]: 336-235-0368  [employer] (furniture):  336-370-4002  [place] [facility]: 336-373-4264  Low Income Energy Assistance  336-641-3000   Food Assistance DHHS- SNAP/ Food Stamps: 336-641-4588  WIC: [place]- 336-641-3663 ;  [place] 336-641-7571  Little Green Book- Free Meals  Little Blue Book- Free Food Pantries  During the summer, text "FOOD" to 877877   General Health / Clinics (Adults) Orange Card (for Adults) through [facility]: (336) 895-4900  [facility]:   336-832-8035  [facility]:   336-832-4444  Health Department:  336-641-3245  [facility]:  336-415-3877 / 336-641-2100  [facility] [place]:   336-373-0678  [facility]:   336-334-4822 x 50251   Housing [place] [facility]:   336-691-9521  [place] Housing Authority:  336-275-8501  Affordable Housing Managemnt:  336-273-0568   Immigrant/ Refugee Center for [place]ians ([employer]):  336-256-1065  [facility]:  336-379-0037  [employer] [facility]:  336-937-4701  [employer] Services:  336-617-0381  African Services Coalition:  336-574-2677   LGBTQ YouthSAFE  www.youthsafe[place].org  PFLAG  336-541-6754 / info@[place].org  The Trevor Project:  1-866-488-7386   Mental Health/ Substance Use [facility]  336-387-6161  [facility]:  336-832-9700 or 1-800-711-2635  [facility] Care:  336-271-5888  [facility]:  336-294-1349  [facility]:  336-542-2884  [facility] (walk-ins)  336-676-6840 / [address]  [place]:  800-449-1287  Alcoholics Anonymous:  336-854-4278  Narcotics Anonymous:  800-365-1036  Quit Smoking Hotline:  800-QUIT-NOW (800-784-8669)   Parenting Children's Home Society:  800-632-1400  [place]: Education  Center & Support Groups:  336-832-6682  YWCA: 336-273-3461  [employer]: Bringing Out the Best:  336-334-3120               Thriving at Three (Hispanic families): 336-256-1066  Healthy Start ([facility]):  336-387-6161 x2288  Parents as Teachers:  336-691-0024  [place] [facility]- Learning Together (Immigrants): 336-369-5001   Poison Control 800-222-1222  Sports & Recreation [employer] Open Doors Application: ymcanwnc.org/join/open-doors-financial-assistance/  City of [place] Recreation Centers: http://www.[place].gov/index.aspx?page=3615   Special Needs Family Support Network:  336-832-6507  Autism Society of [place]:   336-333-0197 x1402 or x1412 /  800-785-1035  [employer] [place]:  336-334-5773  ARC of [place]:  336-373-1076  Children's Developmental Service Agency (CDSA):  336-334-5601  CC4C (Care Coordination for Children):  336-641-7641   Transportation Medicaid Transportation: 336-641-4848 to apply  [place] Transit Authority: 336-335-6499 (reduced-fare bus ID to Medicaid/ Medicare/ Orange Card)  SCAT Paratransit services: Eligible riders only, call 336-333-6589 for application   Tutoring/Mentoring [facility] Child Development Institute: 336-230-2138  Big Brothers/ Big Sisters: 336-378-9100 ([place])  336-882-4167 ([place])  ACES through child's school: 336-370-2321  [employer] Achievers: contact your local Y  SHIELD Mentor Program: 336-337-2771   

## 2016-12-19 NOTE — Progress Notes (Signed)
Subjective:  Patient ID: Patrick Herman, male    DOB: 01-13-1976  Age: 41 y.o. MRN: [id number]  CC:  anxiety  HPI Patrick Herman is a 41 y.o. male with a PMH of anxiety presents as a new patient. He served 7 years in prison has been out for 35 days. Feels uncomfortable around people and crowds. Becomes agitated, heart begins to race, becomes sweaty, visual blurring, and dizziness. Currently living with and taking care of parents, mother has dementia and father has cancer. Doing his best to take care of them. He is trying to stay busy with physical exercise six days a week. He is currently going to suboxone clinic but having difficulty in paying for it. 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 Was taking drugs in prison, "there are ways to get it". PHQ2 score zero, GAD7 score 11 today in clinic. Does not endorse any other symptoms or complaints.     ROS Review of Systems  Constitutional: Negative for chills, fever and malaise/fatigue.  Eyes: Negative for blurred vision.  Respiratory: Negative for shortness of breath.   Cardiovascular: Negative for chest pain and palpitations.  Gastrointestinal: Negative for abdominal pain and nausea.  Genitourinary: Negative for dysuria and hematuria.  Musculoskeletal: Negative for joint pain and myalgias.  Skin: Negative for rash.  Neurological: Negative for tingling and headaches.  Psychiatric/Behavioral: Negative for depression. The patient is nervous/anxious.     Objective:  BP (!) 154/73 (BP Location: Left Arm, Patient Position: Sitting, Cuff Size: Normal)   Pulse 72   Temp 98 F (36.7 C) (Oral)   Ht 5\' 10"  (1.778 m)   Wt 174 lb 9.6 oz (79.2 kg)   SpO2 99%   BMI 25.05 kg/m   BP/Weight 12/19/2016  Systolic BP 154  Diastolic BP 73  Wt. (Lbs) 174.6  BMI 25.05      Physical Exam  Constitutional: He is oriented to person, place, and time.  Well developed, well nourished, NAD, polite, multiple tattoos.  HENT:  Head: Normocephalic and atraumatic.   Eyes: No scleral icterus.  Neck: Normal range of motion. Neck supple. No thyromegaly present.  Cardiovascular: Normal rate, regular rhythm and normal heart sounds.   Pulmonary/Chest: Effort normal and breath sounds normal.  Musculoskeletal: He exhibits no edema.  Neurological: He is alert and oriented to person, place, and time. No cranial nerve deficit. Coordination normal.  Skin: Skin is warm and dry. No rash noted. No erythema. No pallor.  Psychiatric: His behavior is normal. Thought content normal.  Mood essentially normal but pt is somewhat on edge.  Vitals reviewed.    Assessment & Plan:   1. Generalized anxiety disorder - Begin escitalopram (LEXAPRO) 10 MG tablet; Take 1 tablet (10 mg total) by mouth daily.  Dispense: 30 tablet; Refill: 2 - clonazePAM (KLONOPIN) 0.5 MG tablet; Take 1 tablet (0.5 mg total) by mouth at bedtime.  Dispense: 30 tablet; Refill: 0 - I printed out community resource information for patient.  I have instructed him to [facility] psychological/psych[facility] care.    Meds ordered this encounter  Medications  . escitalopram (LEXAPRO) 10 MG tablet    Sig: Take 1 tablet (10 mg total) by mouth daily.    Dispense:  30 tablet    Refill:  2    Order Specific Question:   Supervising Provider    Answer:   Quentin Angst [id number]  . clonazePAM (KLONOPIN) 0.5 MG tablet    Sig: Take 1 tablet (0.5 mg total) by mouth at  bedtime.    Dispense:  30 tablet    Refill:  0    Order Specific Question:   Supervising Provider    Answer:   Quentin AngstJEGEDE, OLUGBEMIGA E [id number]]    Follow-up: Return in about 4 weeks (around 01/16/2017) for anxiety.   Loletta Specteroger David Gomez PA

## 2016-12-20 LAB — COMPREHENSIVE METABOLIC PANEL
ALBUMIN: 4.6 g/dL (ref 3.5–5.5)
ALT: 24 IU/L (ref 0–44)
AST: 25 IU/L (ref 0–40)
Albumin/Globulin Ratio: 1.9 (ref 1.2–2.2)
Alkaline Phosphatase: 55 IU/L (ref 39–117)
BUN / CREAT RATIO: 15 (ref 9–20)
BUN: 19 mg/dL (ref 6–24)
Bilirubin Total: 0.4 mg/dL (ref 0.0–1.2)
CALCIUM: 9.2 mg/dL (ref 8.7–10.2)
CO2: 25 mmol/L (ref 20–29)
Chloride: 98 mmol/L (ref 96–106)
Creatinine, Ser: 1.27 mg/dL (ref 0.76–1.27)
GFR, EST AFRICAN AMERICAN: 81 mL/min/{1.73_m2} (ref >59)
GFR, EST NON AFRICAN AMERICAN: 70 mL/min/{1.73_m2} (ref >59)
Globulin, Total: 2.4 g/dL (ref 1.5–4.5)
Glucose: 74 mg/dL (ref 65–99)
Potassium: 4.2 mmol/L (ref 3.5–5.2)
Sodium: 139 mmol/L (ref 134–144)
TOTAL PROTEIN: 7 g/dL (ref 6.0–8.5)

## 2016-12-20 LAB — CBC WITH DIFFERENTIAL/PLATELET
BASOS: 1 %
Basophils Absolute: 0.1 10*3/uL (ref 0.0–0.2)
EOS (ABSOLUTE): 0.6 10*3/uL — AB (ref 0.0–0.4)
Eos: 12 %
HEMOGLOBIN: 14.4 g/dL (ref 13.0–17.7)
Hematocrit: 42.9 % (ref 37.5–51.0)
IMMATURE GRANS (ABS): 0 10*3/uL (ref 0.0–0.1)
IMMATURE GRANULOCYTES: 0 %
LYMPHS: 32 %
Lymphocytes Absolute: 1.7 10*3/uL (ref 0.7–3.1)
MCH: 29.6 pg (ref 26.6–33.0)
MCHC: 33.6 g/dL (ref 31.5–35.7)
MCV: 88 fL (ref 79–97)
MONOCYTES: 8 %
Monocytes Absolute: 0.4 10*3/uL (ref 0.1–0.9)
NEUTROS ABS: 2.4 10*3/uL (ref 1.4–7.0)
NEUTROS PCT: 47 %
Platelets: 226 10*3/uL (ref 150–379)
RBC: 4.87 x10E6/uL (ref 4.14–5.80)
RDW: 13.4 % (ref 12.3–15.4)
WBC: 5.1 10*3/uL (ref 3.4–10.8)

## 2016-12-20 LAB — TSH: TSH: 2.18 u[IU]/mL (ref 0.450–4.500)

## 2016-12-22 ENCOUNTER — Telehealth (INDEPENDENT_AMBULATORY_CARE_PROVIDER_SITE_OTHER): Payer: Self-pay | Admitting: Physician Assistant

## 2017-01-03 ENCOUNTER — Ambulatory Visit (INDEPENDENT_AMBULATORY_CARE_PROVIDER_SITE_OTHER): Payer: Medicaid Other | Admitting: Physician Assistant

## 2017-01-16 ENCOUNTER — Ambulatory Visit (INDEPENDENT_AMBULATORY_CARE_PROVIDER_SITE_OTHER): Payer: Medicaid Other | Admitting: Physician Assistant
